# Patient Record
Sex: Female | Born: 1985 | Race: White | Hispanic: No | Marital: Married | Smoking: Never smoker
Health system: Southern US, Community
[De-identification: ages and names within clinical notes are randomized; demographics above are authoritative.]

## PROBLEM LIST (undated history)

## (undated) ENCOUNTER — Inpatient Hospital Stay (HOSPITAL_COMMUNITY): Payer: Self-pay

## (undated) DIAGNOSIS — N970 Female infertility associated with anovulation: Secondary | ICD-10-CM

## (undated) DIAGNOSIS — G90A Postural orthostatic tachycardia syndrome (POTS): Secondary | ICD-10-CM

## (undated) DIAGNOSIS — Z3480 Encounter for supervision of other normal pregnancy, unspecified trimester: Secondary | ICD-10-CM

## (undated) DIAGNOSIS — I498 Other specified cardiac arrhythmias: Secondary | ICD-10-CM

## (undated) DIAGNOSIS — Z3493 Encounter for supervision of normal pregnancy, unspecified, third trimester: Secondary | ICD-10-CM

## (undated) DIAGNOSIS — A6000 Herpesviral infection of urogenital system, unspecified: Secondary | ICD-10-CM

## (undated) DIAGNOSIS — R000 Tachycardia, unspecified: Secondary | ICD-10-CM

## (undated) DIAGNOSIS — N921 Excessive and frequent menstruation with irregular cycle: Secondary | ICD-10-CM

## (undated) DIAGNOSIS — E079 Disorder of thyroid, unspecified: Secondary | ICD-10-CM

## (undated) DIAGNOSIS — R7989 Other specified abnormal findings of blood chemistry: Secondary | ICD-10-CM

## (undated) DIAGNOSIS — D72829 Elevated white blood cell count, unspecified: Secondary | ICD-10-CM

## (undated) DIAGNOSIS — I2699 Other pulmonary embolism without acute cor pulmonale: Secondary | ICD-10-CM

## (undated) DIAGNOSIS — E282 Polycystic ovarian syndrome: Secondary | ICD-10-CM

## (undated) DIAGNOSIS — O039 Complete or unspecified spontaneous abortion without complication: Secondary | ICD-10-CM

## (undated) DIAGNOSIS — O210 Mild hyperemesis gravidarum: Secondary | ICD-10-CM

## (undated) DIAGNOSIS — I951 Orthostatic hypotension: Secondary | ICD-10-CM

## (undated) DIAGNOSIS — O0901 Supervision of pregnancy with history of infertility, first trimester: Secondary | ICD-10-CM

## (undated) HISTORY — DX: Disorder of thyroid, unspecified: E07.9

## (undated) HISTORY — DX: Excessive and frequent menstruation with irregular cycle: N92.1

## (undated) HISTORY — DX: Female infertility associated with anovulation: N97.0

## (undated) HISTORY — DX: Mild hyperemesis gravidarum: O21.0

## (undated) HISTORY — DX: Encounter for supervision of other normal pregnancy, unspecified trimester: Z34.80

## (undated) HISTORY — DX: Other specified abnormal findings of blood chemistry: R79.89

## (undated) HISTORY — DX: Elevated white blood cell count, unspecified: D72.829

## (undated) HISTORY — DX: Polycystic ovarian syndrome: E28.2

## (undated) HISTORY — DX: Herpesviral infection of urogenital system, unspecified: A60.00

## (undated) HISTORY — DX: Complete or unspecified spontaneous abortion without complication: O03.9

## (undated) HISTORY — DX: Supervision of pregnancy with history of infertility, first trimester: O09.01

---
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
## 2012-07-19 DIAGNOSIS — N921 Excessive and frequent menstruation with irregular cycle: Secondary | ICD-10-CM

## 2012-07-19 DIAGNOSIS — N970 Female infertility associated with anovulation: Secondary | ICD-10-CM

## 2012-07-19 HISTORY — DX: Excessive and frequent menstruation with irregular cycle: N92.1

## 2012-07-19 HISTORY — DX: Female infertility associated with anovulation: N97.0

## 2013-01-20 HISTORY — PX: OTHER SURGICAL HISTORY: SHX169

## 2013-02-17 DIAGNOSIS — O0901 Supervision of pregnancy with history of infertility, first trimester: Secondary | ICD-10-CM

## 2013-02-17 HISTORY — DX: Supervision of pregnancy with history of infertility, first trimester: O09.01

## 2013-04-26 ENCOUNTER — Encounter (HOSPITAL_COMMUNITY): Payer: Self-pay | Admitting: Pharmacy Technician

## 2013-04-26 ENCOUNTER — Encounter (HOSPITAL_COMMUNITY): Payer: Self-pay | Admitting: Obstetrics and Gynecology

## 2013-04-26 DIAGNOSIS — IMO0002 Reserved for concepts with insufficient information to code with codable children: Secondary | ICD-10-CM

## 2013-04-26 HISTORY — DX: Reserved for concepts with insufficient information to code with codable children: IMO0002

## 2013-04-26 NOTE — H&P (Signed)
Vicki Ortega is an 27 y.o. female. With RPOC after IVF pregnancy as missed AB.  Has had several rounds of cytotec, with small portion of likely RPOC on [place].  Pt desires definitive management, have d/w her R/B/A of D&C.   Pertinent Gynecological History: Menses: PCOS Contraception: none  DES exposure: denies Blood transfusions: none Sexually transmitted diseases: no past history Previous GYN Procedures: none  Last pap: normal  OB History: G2 P0020   Menstrual History:   No LMP recorded.    Past Medical History  Diagnosis Date  . Retained products of conception without hemorrhage 04/26/2013  Cardiac Ablation  No past surgical history on file.  FH: DM and MI Social History:  has no tobacco, alcohol, and drug history on file.married, RN  Allergies: No Known Allergies  Meds PNV    Review of Systems  Constitutional: Negative.   HENT: Negative.   Eyes: Negative.   Respiratory: Negative.   Cardiovascular: Negative.   Gastrointestinal: Negative.   Genitourinary: Negative.   Musculoskeletal: Negative.   Skin: Negative.   Neurological: Negative.   Psychiatric/Behavioral: Negative.     There were no vitals taken for this visit. Physical Exam  Constitutional: She is oriented to person, place, and time. She appears well-developed and well-nourished.  HENT:  Head: Normocephalic and atraumatic.  Cardiovascular: Normal rate and regular rhythm.   Respiratory: Effort normal and breath sounds normal. No respiratory distress.  GI: Soft. Bowel sounds are normal. There is no tenderness.  Musculoskeletal: Normal range of motion.  Neurological: She is alert and oriented to person, place, and time.  Skin: Skin is warm and dry.  Psychiatric: She has a normal mood and affect. Her behavior is normal.    No results found for this or any previous visit (from the past 24 hour(s)).  US shows small RPOC  Assessment/Plan: 27yo G2P0020 s/p missed AB treated with cytotec - several rounds  with small ammount RPOC.  Pt desires D&E for definitive management.  D/w pt r/b/a - voices understandi   BOVARD,JODYng 04/26/2013, 9:02 PM

## 2013-04-27 ENCOUNTER — Encounter (HOSPITAL_COMMUNITY): Payer: Self-pay | Admitting: Anesthesiology

## 2013-04-27 ENCOUNTER — Ambulatory Visit (HOSPITAL_COMMUNITY)
Admission: RE | Admit: 2013-04-27 | Discharge: 2013-04-27 | Disposition: A | Discharge status: Home / Self Care | Payer: BC Managed Care – PPO | Source: Ambulatory Visit | Attending: Obstetrics and Gynecology | Admitting: Obstetrics and Gynecology

## 2013-04-27 ENCOUNTER — Ambulatory Visit (HOSPITAL_COMMUNITY): Payer: BC Managed Care – PPO | Admitting: Anesthesiology

## 2013-04-27 ENCOUNTER — Encounter (HOSPITAL_COMMUNITY): Payer: Self-pay | Admitting: *Deleted

## 2013-04-27 ENCOUNTER — Encounter (HOSPITAL_COMMUNITY)
Admission: RE | Disposition: A | Discharge status: Home / Self Care | Payer: Self-pay | Source: Ambulatory Visit | Attending: Obstetrics and Gynecology

## 2013-04-27 DIAGNOSIS — IMO0002 Reserved for concepts with insufficient information to code with codable children: Secondary | ICD-10-CM

## 2013-04-27 DIAGNOSIS — O021 Missed abortion: Secondary | ICD-10-CM | POA: Insufficient documentation

## 2013-04-27 HISTORY — DX: Tachycardia, unspecified: R00.0

## 2013-04-27 HISTORY — PX: DILATION AND EVACUATION: SHX1459

## 2013-04-27 LAB — CBC
HCT: 45.7 % (ref 36.0–46.0)
Platelets: 304 10*3/uL (ref 150–400)
RDW: 11.9 % (ref 11.5–15.5)
WBC: 6.4 10*3/uL (ref 4.0–10.5)

## 2013-04-27 SURGERY — DILATION AND EVACUATION, UTERUS
Anesthesia: Monitor Anesthesia Care | Site: Uterus | Wound class: Clean Contaminated

## 2013-04-27 MED ORDER — MIDAZOLAM HCL 2 MG/2ML IJ SOLN
INTRAMUSCULAR | Status: AC
  Filled 2013-04-27: qty 2

## 2013-04-27 MED ORDER — IBUPROFEN 800 MG PO TABS: 800.0000 mg | ORAL_TABLET | Freq: Three times a day (TID) | ORAL | Status: DC | PRN

## 2013-04-27 MED ORDER — PROPOFOL 10 MG/ML IV EMUL
INTRAVENOUS | Status: DC | PRN
  Administered 2013-04-27: 50 mg via INTRAVENOUS
  Administered 2013-04-27: 20 mg via INTRAVENOUS
  Administered 2013-04-27: 50 mg via INTRAVENOUS

## 2013-04-27 MED ORDER — FENTANYL CITRATE 0.05 MG/ML IJ SOLN: 25.0000 ug | INTRAMUSCULAR | Status: DC | PRN

## 2013-04-27 MED ORDER — MEPERIDINE HCL 25 MG/ML IJ SOLN: 6.2500 mg | INTRAMUSCULAR | Status: DC | PRN

## 2013-04-27 MED ORDER — DOXYCYCLINE HYCLATE 100 MG PO TABS
ORAL_TABLET | ORAL | Status: AC
  Administered 2013-04-27: 100 mg via ORAL
  Filled 2013-04-27: qty 1

## 2013-04-27 MED ORDER — GLYCOPYRROLATE 0.2 MG/ML IJ SOLN
INTRAMUSCULAR | Status: DC | PRN
  Administered 2013-04-27: 0.2 mg via INTRAVENOUS

## 2013-04-27 MED ORDER — DOXYCYCLINE HYCLATE 100 MG PO TABS
ORAL_TABLET | ORAL | Status: AC
  Filled 2013-04-27: qty 1

## 2013-04-27 MED ORDER — LIDOCAINE HCL 2 % IJ SOLN
INTRAMUSCULAR | Status: AC
  Filled 2013-04-27: qty 20

## 2013-04-27 MED ORDER — FENTANYL CITRATE 0.05 MG/ML IJ SOLN
INTRAMUSCULAR | Status: DC | PRN
  Administered 2013-04-27 (×2): 50 ug via INTRAVENOUS

## 2013-04-27 MED ORDER — LACTATED RINGERS IV SOLN
INTRAVENOUS | Status: DC
  Administered 2013-04-27: 08:00:00 via INTRAVENOUS

## 2013-04-27 MED ORDER — HYDROCODONE-ACETAMINOPHEN 5-325 MG PO TABS: 1.0000 | ORAL_TABLET | Freq: Four times a day (QID) | ORAL | Status: DC | PRN

## 2013-04-27 MED ORDER — METOCLOPRAMIDE HCL 5 MG/ML IJ SOLN: 10.0000 mg | Freq: Once | INTRAMUSCULAR | Status: DC | PRN

## 2013-04-27 MED ORDER — DEXAMETHASONE SODIUM PHOSPHATE 10 MG/ML IJ SOLN
INTRAMUSCULAR | Status: DC | PRN
  Administered 2013-04-27: 5 mg via INTRAVENOUS

## 2013-04-27 MED ORDER — MIDAZOLAM HCL 5 MG/5ML IJ SOLN
INTRAMUSCULAR | Status: DC | PRN
  Administered 2013-04-27: 2 mg via INTRAVENOUS

## 2013-04-27 MED ORDER — LIDOCAINE HCL 2 % IJ SOLN
INTRAMUSCULAR | Status: DC | PRN
  Administered 2013-04-27: 10 mL

## 2013-04-27 MED ORDER — LIDOCAINE HCL (CARDIAC) 20 MG/ML IV SOLN
INTRAVENOUS | Status: DC | PRN
  Administered 2013-04-27: 50 mg via INTRAVENOUS

## 2013-04-27 MED ORDER — KETOROLAC TROMETHAMINE 30 MG/ML IJ SOLN
INTRAMUSCULAR | Status: DC | PRN
  Administered 2013-04-27: 30 mg via INTRAVENOUS

## 2013-04-27 MED ORDER — FENTANYL CITRATE 0.05 MG/ML IJ SOLN
INTRAMUSCULAR | Status: AC
  Filled 2013-04-27: qty 2

## 2013-04-27 MED ORDER — LACTATED RINGERS IV SOLN: INTRAVENOUS | Status: DC

## 2013-04-27 MED ORDER — ONDANSETRON HCL 4 MG/2ML IJ SOLN
INTRAMUSCULAR | Status: DC | PRN
  Administered 2013-04-27: 4 mg via INTRAVENOUS

## 2013-04-27 MED ORDER — DOXYCYCLINE HYCLATE 100 MG PO TABS
100.0000 mg | ORAL_TABLET | Freq: Two times a day (BID) | ORAL | Status: AC
  Administered 2013-04-27 (×2): 100 mg via ORAL

## 2013-04-27 SURGICAL SUPPLY — 19 items
CATH ROBINSON RED A/P 16FR (CATHETERS) ×2 IMPLANT
CLOTH BEACON ORANGE TIMEOUT ST (SAFETY) ×2 IMPLANT
DECANTER SPIKE VIAL GLASS SM (MISCELLANEOUS) ×2 IMPLANT
GLOVE BIO SURGEON STRL SZ 6.5 (GLOVE) ×2 IMPLANT
GLOVE BIO SURGEON STRL SZ7 (GLOVE) ×2 IMPLANT
GOWN STRL REIN XL XLG (GOWN DISPOSABLE) ×4 IMPLANT
KIT BERKELEY 1ST TRIMESTER 3/8 (MISCELLANEOUS) ×2 IMPLANT
NEEDLE SPNL 22GX3.5 QUINCKE BK (NEEDLE) ×2 IMPLANT
NS IRRIG 1000ML POUR BTL (IV SOLUTION) ×2 IMPLANT
PACK VAGINAL MINOR WOMEN LF (CUSTOM PROCEDURE TRAY) ×2 IMPLANT
PAD OB MATERNITY 4.3X12.25 (PERSONAL CARE ITEMS) ×2 IMPLANT
PAD PREP 24X48 CUFFED NSTRL (MISCELLANEOUS) ×2 IMPLANT
SET BERKELEY SUCTION TUBING (SUCTIONS) ×2 IMPLANT
SYR CONTROL 10ML LL (SYRINGE) ×2 IMPLANT
TOWEL OR 17X24 6PK STRL BLUE (TOWEL DISPOSABLE) ×4 IMPLANT
VACURETTE 10 RIGID CVD (CANNULA) IMPLANT
VACURETTE 7MM CVD STRL WRAP (CANNULA) ×2 IMPLANT
VACURETTE 8 RIGID CVD (CANNULA) IMPLANT
VACURETTE 9 RIGID CVD (CANNULA) IMPLANT

## 2013-04-27 NOTE — Transfer of Care (Signed)
Immediate Anesthesia Transfer of Care Note  Patient: Vicki Ortega  Procedure(s) Performed: Procedure(s): DILATATION AND EVACUATION (N/A)  Patient Location: PACU  Anesthesia Type:MAC  Level of Consciousness: awake, alert  and oriented  Airway & Oxygen Therapy: Patient Spontanous Breathing  Post-op Assessment: Report given to PACU RN and Post -op Vital signs reviewed and stable  Post vital signs: Reviewed and stable  Complications: No apparent anesthesia complications

## 2013-04-27 NOTE — Interval H&P Note (Signed)
History and Physical Interval Note:  04/27/2013 8:20 AM  Vicki Ortega  has presented today for surgery, with the diagnosis of missed ab  The various methods of treatment have been discussed with the patient and family. After consideration of risks, benefits and other options for treatment, the patient has consented to  Procedure(s): DILATATION AND EVACUATION (N/A) as a surgical intervention .  The patient's history has been reviewed, patient examined, no change in status, stable for surgery.  I have reviewed the patient's chart and labs.  Questions were answered to the patient's satisfaction.     BOVARD,Bryley Chrisman

## 2013-04-27 NOTE — Anesthesia Postprocedure Evaluation (Signed)
  Anesthesia Post-op Note  Patient: Vicki Ortega  Procedure(s) Performed: Procedure(s): DILATATION AND EVACUATION (N/A)  Patient Location: PACU  Anesthesia Type:MAC  Level of Consciousness: awake, alert  and oriented  Airway and Oxygen Therapy: Patient Spontanous Breathing  Post-op Pain: none  Post-op Assessment: Post-op Vital signs reviewed, Patient's Cardiovascular Status Stable, Respiratory Function Stable, Patent Airway, No signs of Nausea or vomiting and Pain level controlled  Post-op Vital Signs: Reviewed and stable  Complications: No apparent anesthesia complications

## 2013-04-27 NOTE — Anesthesia Preprocedure Evaluation (Addendum)
Anesthesia Evaluation  Patient identified by MRN, date of birth, ID band Patient awake    Reviewed: Allergy & Precautions, H&P , Patient's Chart, lab work & pertinent test results  Airway Mallampati: III TM Distance: >3 FB Neck ROM: Full    Dental no notable dental hx. (+) Teeth Intact   Pulmonary neg pulmonary ROS,  breath sounds clear to auscultation  Pulmonary exam normal       Cardiovascular Rhythm:Regular + Systolic murmurs S/P EPS with ablation for POTS   Neuro/Psych negative neurological ROS  negative psych ROS   GI/Hepatic negative GI ROS, Neg liver ROS,   Endo/Other  negative endocrine ROS  Renal/GU negative Renal ROS  negative genitourinary   Musculoskeletal negative musculoskeletal ROS (+)   Abdominal Normal abdominal exam  (+)   Peds  Hematology negative hematology ROS (+)   Anesthesia Other Findings   Reproductive/Obstetrics Incomplete/Missed Ab Infertility                          Anesthesia Physical Anesthesia Plan  ASA: II  Anesthesia Plan: MAC   Post-op Pain Management:    Induction: Intravenous  Airway Management Planned: Natural Airway  Additional Equipment:   Intra-op Plan:   Post-operative Plan:   Informed Consent: I have reviewed the patients History and Physical, chart, labs and discussed the procedure including the risks, benefits and alternatives for the proposed anesthesia with the patient or authorized representative who has indicated his/her understanding and acceptance.   Dental advisory given  Plan Discussed with: CRNA, Anesthesiologist and Surgeon  Anesthesia Plan Comments:         Anesthesia Quick Evaluation

## 2013-04-27 NOTE — Brief Op Note (Signed)
04/27/2013  9:11 AM  PATIENT:  Vicki Ortega  27 y.o. female  PRE-OPERATIVE DIAGNOSIS:  missed ab, RPOC  POST-OPERATIVE DIAGNOSIS:  missed ab, RPOC  PROCEDURE:  Procedure(s): DILATATION AND EVACUATION (N/A)  SURGEON:  Surgeon(s) and Role:    * Sherron Monday, MD - Primary  ANESTHESIA:   IV sedation/ MAC  EBL:  Total I/O In: 500 [I.V.:500] Out: 30 [Urine:30]  BLOOD ADMINISTERED:none  DRAINS: none   LOCAL MEDICATIONS USED:  LIDOCAINE   SPECIMEN:  Source of Specimen:  POC  DISPOSITION OF SPECIMEN:  PATHOLOGY  COUNTS:  YES  TOURNIQUET:  * No tourniquets in log *  DICTATION: .Other Dictation: Dictation Number [id number]  PLAN OF CARE: Discharge to home after PACU  PATIENT DISPOSITION:  PACU - hemodynamically stable.   Delay start of Pharmacological VTE agent (>24hrs) due to surgical blood loss or risk of bleeding: not applicable

## 2013-04-28 DIAGNOSIS — O021 Missed abortion: Secondary | ICD-10-CM

## 2013-04-28 NOTE — Op Note (Signed)
NAME:  Vicki Ortega, ESTEVE NO.:  [id number]  MEDICAL RECORD NO.:  [id number]  LOCATION:  [facility]                          FACILITY:  [facility]  PHYSICIAN:  Sherron Monday, MD        DATE OF BIRTH:  [birth year]  DATE OF PROCEDURE:  04/27/2013 DATE OF DISCHARGE:  04/27/2013                              OPERATIVE REPORT   PREOPERATIVE DIAGNOSIS:  Missed abortion, status post Cytotec with retained products of conception.  POSTOPERATIVE DIAGNOSIS:  Missed abortion, status post Cytotec with retained products of conception.  PROCEDURE:  D and E.  SURGEON:  Alazar Cherian Bovard, MD  ANESTHESIA:  MAC.  ESTIMATED BLOOD LOSS:  Minimal.  IV FLUIDS:  500 mL.  URINE OUTPUT:  30 mL by I and O cath prior to procedure.  SPECIMEN:  Products of conception to Pathology.  COMPLICATIONS:  None.  DESCRIPTION OF PROCEDURE:  After informed consent was reviewed with the patient and her husband, she was transported to the operating room, where MAC anesthesia was placed and found to be adequate.  She was then placed in the [place] stirrups, prepped and draped in the normal sterile fashion.  An open-sided speculum was used to easily visualize her cervix.  A 10 mL of 2% lidocaine were used for paracervical block. A single-tooth tenaculum was applied to the anterior lip.  The cervix was dilated to accommodate a 7-French curette using Pratt dilators to 21.  Prior to the procedure, bimanual exam was performed, finding the uterus to be in midline position.  Uterus was measured and found to be approximately 7 cm.  With 2 to 3 passes with the curette, the products were evacuated from the uterus, this was sent to Pathology.  The tenaculum was removed. The cervix was noted to be hemostatic.  Speculum was removed.  The patient was turned in supine position.  Sponge, lap, and needle counts were correct x2.  The patient tolerated the procedure well.  She was transferred back in stable condition.     Sherron Monday, MD     JB/MEDQ  D:  04/27/2013  T:  04/28/2013  Job:  [id number]

## 2013-04-30 ENCOUNTER — Encounter (HOSPITAL_COMMUNITY): Payer: Self-pay | Admitting: Obstetrics and Gynecology

## 2014-03-05 DIAGNOSIS — R7989 Other specified abnormal findings of blood chemistry: Secondary | ICD-10-CM | POA: Insufficient documentation

## 2014-03-05 HISTORY — DX: Other specified abnormal findings of blood chemistry: R79.89

## 2014-07-08 LAB — OB RESULTS CONSOLE ABO/RH: RH Type: POSITIVE

## 2014-07-08 LAB — OB RESULTS CONSOLE RPR: RPR: NONREACTIVE

## 2014-07-08 LAB — OB RESULTS CONSOLE HIV ANTIBODY (ROUTINE TESTING): HIV: NONREACTIVE

## 2014-08-30 NOTE — L&D Delivery Note (Signed)
Delivery Note At 8:31 PM a viable and healthy female was delivered via Vaginal, Spontaneous Delivery (Presentation: OA; ROT  ).  APGAR: 8,9 ; weight P .   Placenta status: delivered, intact.  Cord: 3VC  with the following complications: nuchal.    Anesthesia: Epidural  Episiotomy:  None Lacerations:  R labial Suture Repair: 3.0 vicryl rapide Est. Blood Loss (mL):  400cc  Mom to postpartum.  Baby to Couplet care / Skin to Skin.  Bovard-Stuckert, Cherylin Waguespack 10/01/2014, 8:58 PM  D/W pt and FOB circumcision for female infant including r/b/a  Br/O+/Contra ?/Tdap in PNC/R?

## 2014-09-05 LAB — OB RESULTS CONSOLE GBS: GBS: NEGATIVE

## 2014-09-30 ENCOUNTER — Encounter (HOSPITAL_COMMUNITY): Payer: Self-pay | Admitting: *Deleted

## 2014-09-30 ENCOUNTER — Encounter (HOSPITAL_COMMUNITY): Payer: Self-pay

## 2014-09-30 ENCOUNTER — Telehealth (HOSPITAL_COMMUNITY): Payer: Self-pay | Admitting: *Deleted

## 2014-09-30 DIAGNOSIS — Z3493 Encounter for supervision of normal pregnancy, unspecified, third trimester: Secondary | ICD-10-CM

## 2014-09-30 HISTORY — DX: Encounter for supervision of normal pregnancy, unspecified, third trimester: Z34.93

## 2014-09-30 NOTE — H&P (Signed)
Vicki Ortega is a 29 y.o. female G3P0020 at 39+ with IUP from FET for IOL given term status and favorable cervix.  Relatively uncomplicated PNC, except h/o HSV - Valtrex for suppression.  H/o previa resolved.  ? Thyroid dz, nl when evaluated. D/W pt r/b/a of IOL and POC, voices understanding.    Maternal Medical History:  Contractions: Frequency: irregular.   Perceived severity is moderate.    Fetal activity: Perceived fetal activity is normal.    Prenatal complications: no prenatal complications Prenatal Complications - Diabetes: none.    OB History    Gravida Para Term Preterm AB TAB SAB Ectopic Multiple Living   3 0            G1 and G2 SAB, G3 present.  No abn pap, h/o HSV 2, rare outbreaks.    Past Medical History  Diagnosis Date  . Retained products of conception without hemorrhage 04/26/2013  . 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 PCOS (polycystic ovarian syndrome)   . Thyroid disease   . Normal intrauterine pregnancy in third trimester 09/30/2014   Past Surgical History  Procedure Laterality Date  . Cardiac electrophysiology mapping and ablation  2004  . Egg retrieval  01/20/13  . Dilation and evacuation N/A 04/27/2013    Procedure: DILATATION AND EVACUATION;  Surgeon: Sherron MondayJody Bovard, MD;  Location: [facility];  Service: Gynecology;  Laterality: N/A;   Family History: family history includes Diabetes in her mother; Heart attack in her paternal grandfather. Social History:  reports that she has never smoked. She has never used smokeless tobacco. She reports that she does not drink alcohol or use illicit drugs. married, RN psychiatry Meds PNV, Valrex All NKDA   Prenatal Transfer Tool  Maternal Diabetes: No Genetic Screening: Normal Maternal Ultrasounds/Referrals: Normal Fetal Ultrasounds or other Referrals:  None Maternal Substance Abuse:  No Significant Maternal Medications:  None Significant Maternal Lab Results:  Lab values include:  Group B Strep negative Other Comments:  nl thyroid screening, previa resolved,   Review of Systems  Constitutional: Negative.   HENT: Negative.   Eyes: Negative.   Respiratory: Negative.   Cardiovascular: Negative.   Gastrointestinal: Negative.   Genitourinary: Negative.   Musculoskeletal: Negative.   Skin: Negative.   Neurological: Negative.   Psychiatric/Behavioral: Negative.       Last menstrual period 12/28/2013. Maternal Exam:  Abdomen: Patient reports no abdominal tenderness. Fundal height is appropriate for gestation.   Estimated fetal weight is 7-8#.   Fetal presentation: vertex  Introitus: Normal vulva. Normal vagina.  Pelvis: adequate for delivery.   Cervix: Cervix evaluated by digital exam.     Physical Exam  Constitutional: She is oriented to person, place, and time. She appears well-developed and well-nourished.  HENT:  Head: Normocephalic and atraumatic.  Cardiovascular: Normal rate and regular rhythm.   Respiratory: Effort normal and breath sounds normal. No respiratory distress. She has no wheezes.  GI: Soft. Bowel sounds are normal. She exhibits no distension. There is no tenderness.  Musculoskeletal: Normal range of motion.  Neurological: She is alert and oriented to person, place, and time.  Skin: Skin is warm and dry.  Psychiatric: She has a normal mood and affect. Her behavior is normal.    Prenatal labs: ABO, Rh: O/Positive/-- (11/09 0000) Antibody:   Rubella:   RPR: Nonreactive (11/09 0000)  HBsAg:    HIV: Non-reactive (11/09 0000)  GBS: Negative (01/07 0000)   First Tri Screen WNL, nl  [place] - w nl NT, glucola 119, TSH WNL  Nl anal post plac LLP,  Previa resolved Tdao 11/15 CF neg "Vicki Ortega"  Assessment/Plan: [id number] at 39+ for IOL gbbs neg IOL w/ ptiocin and AROM expect SVDepidural prn    Bovard-Stuckert, Kaidin Boehle 09/30/2014, 10:10 PM

## 2014-09-30 NOTE — Telephone Encounter (Signed)
Preadmission screen  

## 2014-10-01 ENCOUNTER — Inpatient Hospital Stay (HOSPITAL_COMMUNITY)
Admission: RE | Admit: 2014-10-01 | Discharge: 2014-10-03 | DRG: 775 | Disposition: A | Discharge status: Home / Self Care | Payer: PRIVATE HEALTH INSURANCE | Source: Ambulatory Visit | Attending: Obstetrics and Gynecology | Admitting: Obstetrics and Gynecology

## 2014-10-01 ENCOUNTER — Encounter (HOSPITAL_COMMUNITY): Payer: Self-pay

## 2014-10-01 ENCOUNTER — Inpatient Hospital Stay (HOSPITAL_COMMUNITY): Payer: PRIVATE HEALTH INSURANCE | Admitting: Anesthesiology

## 2014-10-01 VITALS — BP 97/59 | HR 61 | Temp 97.2°F | Resp 18 | Ht 64.0 in | Wt 149.0 lb

## 2014-10-01 DIAGNOSIS — Z3A39 39 weeks gestation of pregnancy: Secondary | ICD-10-CM | POA: Diagnosis present

## 2014-10-01 DIAGNOSIS — Z3483 Encounter for supervision of other normal pregnancy, third trimester: Secondary | ICD-10-CM | POA: Diagnosis present

## 2014-10-01 DIAGNOSIS — Z3480 Encounter for supervision of other normal pregnancy, unspecified trimester: Secondary | ICD-10-CM

## 2014-10-01 DIAGNOSIS — Z3493 Encounter for supervision of normal pregnancy, unspecified, third trimester: Secondary | ICD-10-CM

## 2014-10-01 HISTORY — DX: Encounter for supervision of other normal pregnancy, unspecified trimester: Z34.80

## 2014-10-01 HISTORY — DX: Encounter for supervision of normal pregnancy, unspecified, third trimester: Z34.93

## 2014-10-01 LAB — CBC
HEMATOCRIT: 39.1 % (ref 36.0–46.0)
Hemoglobin: 12.7 g/dL (ref 12.0–15.0)
MCH: 28.9 pg (ref 26.0–34.0)
MCHC: 32.5 g/dL (ref 30.0–36.0)
MCV: 88.9 fL (ref 78.0–100.0)
Platelets: 325 10*3/uL (ref 150–400)
RBC: 4.4 MIL/uL (ref 3.87–5.11)
RDW: 14.5 % (ref 11.5–15.5)
WBC: 13.8 10*3/uL — ABNORMAL HIGH (ref 4.0–10.5)

## 2014-10-01 LAB — TYPE AND SCREEN
ABO/RH(D): O POS
Antibody Screen: NEGATIVE

## 2014-10-01 LAB — ABO/RH: ABO/RH(D): O POS

## 2014-10-01 MED ORDER — OXYTOCIN 40 UNITS IN LACTATED RINGERS INFUSION - SIMPLE MED: 62.5000 mL/h | INTRAVENOUS | Status: DC

## 2014-10-01 MED ORDER — BUTORPHANOL TARTRATE 1 MG/ML IJ SOLN
1.0000 mg | INTRAMUSCULAR | Status: DC | PRN
  Administered 2014-10-01: 1 mg via INTRAVENOUS
  Filled 2014-10-01: qty 1

## 2014-10-01 MED ORDER — EPHEDRINE 5 MG/ML INJ
10.0000 mg | INTRAVENOUS | Status: DC | PRN
  Filled 2014-10-01: qty 2

## 2014-10-01 MED ORDER — ONDANSETRON HCL 4 MG/2ML IJ SOLN: 4.0000 mg | Freq: Four times a day (QID) | INTRAMUSCULAR | Status: DC | PRN

## 2014-10-01 MED ORDER — OXYTOCIN 40 UNITS IN LACTATED RINGERS INFUSION - SIMPLE MED
1.0000 m[IU]/min | INTRAVENOUS | Status: DC
Start: 2014-10-01 — End: 2014-10-01
  Administered 2014-10-01: 2 m[IU]/min via INTRAVENOUS
  Filled 2014-10-01: qty 1000

## 2014-10-01 MED ORDER — DIPHENHYDRAMINE HCL 50 MG/ML IJ SOLN: 12.5000 mg | INTRAMUSCULAR | Status: DC | PRN

## 2014-10-01 MED ORDER — OXYCODONE-ACETAMINOPHEN 5-325 MG PO TABS: 2.0000 | ORAL_TABLET | ORAL | Status: DC | PRN

## 2014-10-01 MED ORDER — FENTANYL 2.5 MCG/ML BUPIVACAINE 1/10 % EPIDURAL INFUSION (WH - ANES)
INTRAMUSCULAR | Status: DC | PRN
  Administered 2014-10-01: 14 mL/h via EPIDURAL

## 2014-10-01 MED ORDER — PHENYLEPHRINE 40 MCG/ML (10ML) SYRINGE FOR IV PUSH (FOR BLOOD PRESSURE SUPPORT)
80.0000 ug | PREFILLED_SYRINGE | INTRAVENOUS | Status: DC | PRN
  Filled 2014-10-01: qty 2

## 2014-10-01 MED ORDER — LACTATED RINGERS IV SOLN
INTRAVENOUS | Status: DC
  Administered 2014-10-01: 125 mL/h via INTRAVENOUS

## 2014-10-01 MED ORDER — LIDOCAINE HCL (PF) 1 % IJ SOLN
30.0000 mL | INTRAMUSCULAR | Status: DC | PRN
  Filled 2014-10-01: qty 30

## 2014-10-01 MED ORDER — TERBUTALINE SULFATE 1 MG/ML IJ SOLN
0.2500 mg | Freq: Once | INTRAMUSCULAR | Status: DC | PRN
  Filled 2014-10-01: qty 1

## 2014-10-01 MED ORDER — PHENYLEPHRINE 40 MCG/ML (10ML) SYRINGE FOR IV PUSH (FOR BLOOD PRESSURE SUPPORT)
80.0000 ug | PREFILLED_SYRINGE | INTRAVENOUS | Status: DC | PRN
  Filled 2014-10-01: qty 2
  Filled 2014-10-01: qty 20

## 2014-10-01 MED ORDER — OXYTOCIN BOLUS FROM INFUSION
500.0000 mL | INTRAVENOUS | Status: DC
  Administered 2014-10-01: 500 mL via INTRAVENOUS

## 2014-10-01 MED ORDER — LACTATED RINGERS IV SOLN: 500.0000 mL | INTRAVENOUS | Status: DC | PRN

## 2014-10-01 MED ORDER — FENTANYL 2.5 MCG/ML BUPIVACAINE 1/10 % EPIDURAL INFUSION (WH - ANES)
14.0000 mL/h | INTRAMUSCULAR | Status: DC | PRN
  Administered 2014-10-01: 14 mL/h via EPIDURAL
  Filled 2014-10-01: qty 125

## 2014-10-01 MED ORDER — LIDOCAINE HCL (PF) 1 % IJ SOLN
INTRAMUSCULAR | Status: DC | PRN
Start: 2014-10-01 — End: 2014-10-01
  Administered 2014-10-01 (×2): 9 mL

## 2014-10-01 MED ORDER — ACETAMINOPHEN 325 MG PO TABS: 650.0000 mg | ORAL_TABLET | ORAL | Status: DC | PRN

## 2014-10-01 MED ORDER — OXYCODONE-ACETAMINOPHEN 5-325 MG PO TABS: 1.0000 | ORAL_TABLET | ORAL | Status: DC | PRN

## 2014-10-01 MED ORDER — CITRIC ACID-SODIUM CITRATE 334-500 MG/5ML PO SOLN: 30.0000 mL | ORAL | Status: DC | PRN

## 2014-10-01 MED ORDER — LACTATED RINGERS IV SOLN
500.0000 mL | Freq: Once | INTRAVENOUS | Status: AC
  Administered 2014-10-01: 500 mL via INTRAVENOUS

## 2014-10-01 NOTE — Anesthesia Procedure Notes (Signed)
Epidural Patient location during procedure: OB Start time: 10/01/2014 3:35 PM End time: 10/01/2014 3:39 PM  Staffing Anesthesiologist: Leilani AbleHATCHETT, Hussein Macdougal Performed by: anesthesiologist   Preanesthetic Checklist Completed: patient identified, surgical consent, pre-op evaluation, timeout performed, IV checked, risks and benefits discussed and monitors and equipment checked  Epidural Patient position: sitting Prep: site prepped and draped and DuraPrep Patient monitoring: continuous pulse ox and blood pressure Approach: midline Location: L3-L4 Injection technique: LOR air  Needle:  Needle type: Tuohy  Needle gauge: 17 G Needle length: 9 cm and 9 Needle insertion depth: 5 cm cm Catheter type: closed end flexible Catheter size: 19 Gauge Catheter at skin depth: 10 cm Test dose: negative and Other  Assessment Sensory level: T9 Events: blood not aspirated, injection not painful, no injection resistance, negative IV test and no paresthesia

## 2014-10-01 NOTE — Progress Notes (Signed)
Patient ID: Vicki Ortega, female   DOB: 27-Apr-1986, 29 y.o.   MRN: [id number]  Comfortable with epidural  AFVSS gen NAD FHTs 120s, great variability, some early decels toco Q 3min  SVE 8.9/90/0-+1  Continue IOL, expect SVD

## 2014-10-01 NOTE — Progress Notes (Signed)
Patient ID: Vicki Ortega, female   DOB: 01/21/1986, 29 y.o.   MRN: [id number]  No c/o's.  +FM  AFVSS  gen NAD FHTs 150's, good var, category 1 toco occ   AROM for clear fluid w/o diff/comp SVE 2.3/50/-2  Continue IOL, close monitoring

## 2014-10-01 NOTE — Progress Notes (Signed)
When pt sits straight up for uc fhr is acturally tracing maternal hr

## 2014-10-01 NOTE — Anesthesia Preprocedure Evaluation (Signed)

## 2014-10-01 NOTE — Progress Notes (Signed)
Dr Ellyn HackBovard notified that Hep B and Rubella Titer needs to be ordered

## 2014-10-02 LAB — CBC
HCT: 32.7 % — ABNORMAL LOW (ref 36.0–46.0)
Hemoglobin: 10.9 g/dL — ABNORMAL LOW (ref 12.0–15.0)
MCH: 29.2 pg (ref 26.0–34.0)
MCHC: 33.3 g/dL (ref 30.0–36.0)
MCV: 87.7 fL (ref 78.0–100.0)
Platelets: 286 10*3/uL (ref 150–400)
RBC: 3.73 MIL/uL — ABNORMAL LOW (ref 3.87–5.11)
RDW: 14.4 % (ref 11.5–15.5)
WBC: 18.8 10*3/uL — AB (ref 4.0–10.5)

## 2014-10-02 LAB — RPR: RPR: NONREACTIVE

## 2014-10-02 LAB — RUBELLA SCREEN: RUBELLA: 1.76 {index} (ref >0.99)

## 2014-10-02 LAB — HEPATITIS B SURFACE ANTIGEN: Hepatitis B Surface Ag: NEGATIVE

## 2014-10-02 MED ORDER — OXYCODONE-ACETAMINOPHEN 5-325 MG PO TABS
2.0000 | ORAL_TABLET | ORAL | Status: DC | PRN
Start: 2014-10-02 — End: 2014-10-03

## 2014-10-02 MED ORDER — ZOLPIDEM TARTRATE 5 MG PO TABS: 5.0000 mg | ORAL_TABLET | Freq: Every evening | ORAL | Status: DC | PRN

## 2014-10-02 MED ORDER — ONDANSETRON HCL 4 MG/2ML IJ SOLN: 4.0000 mg | INTRAMUSCULAR | Status: DC | PRN

## 2014-10-02 MED ORDER — LANOLIN HYDROUS EX OINT: TOPICAL_OINTMENT | CUTANEOUS | Status: DC | PRN

## 2014-10-02 MED ORDER — LACTATED RINGERS IV SOLN
INTRAVENOUS | Status: DC
Start: 2014-10-02 — End: 2014-10-03

## 2014-10-02 MED ORDER — BENZOCAINE-MENTHOL 20-0.5 % EX AERO
1.0000 "application " | INHALATION_SPRAY | CUTANEOUS | Status: DC | PRN
  Administered 2014-10-02: 1 via TOPICAL
  Filled 2014-10-02: qty 56

## 2014-10-02 MED ORDER — DIPHENHYDRAMINE HCL 25 MG PO CAPS: 25.0000 mg | ORAL_CAPSULE | Freq: Four times a day (QID) | ORAL | Status: DC | PRN

## 2014-10-02 MED ORDER — PRENATAL MULTIVITAMIN CH
1.0000 | ORAL_TABLET | Freq: Every day | ORAL | Status: DC
  Administered 2014-10-02: 1 via ORAL
  Filled 2014-10-02: qty 1

## 2014-10-02 MED ORDER — SIMETHICONE 80 MG PO CHEW: 80.0000 mg | CHEWABLE_TABLET | ORAL | Status: DC | PRN

## 2014-10-02 MED ORDER — ONDANSETRON HCL 4 MG PO TABS: 4.0000 mg | ORAL_TABLET | ORAL | Status: DC | PRN

## 2014-10-02 MED ORDER — IBUPROFEN 600 MG PO TABS
600.0000 mg | ORAL_TABLET | Freq: Four times a day (QID) | ORAL | Status: DC
  Administered 2014-10-02 – 2014-10-03 (×6): 600 mg via ORAL
  Filled 2014-10-02 (×6): qty 1

## 2014-10-02 MED ORDER — DIBUCAINE 1 % RE OINT: 1.0000 "application " | TOPICAL_OINTMENT | RECTAL | Status: DC | PRN

## 2014-10-02 MED ORDER — WITCH HAZEL-GLYCERIN EX PADS: 1.0000 "application " | MEDICATED_PAD | CUTANEOUS | Status: DC | PRN

## 2014-10-02 MED ORDER — SENNOSIDES-DOCUSATE SODIUM 8.6-50 MG PO TABS
2.0000 | ORAL_TABLET | ORAL | Status: DC
  Administered 2014-10-02 (×2): 2 via ORAL
  Filled 2014-10-02 (×2): qty 2

## 2014-10-02 MED ORDER — OXYCODONE-ACETAMINOPHEN 5-325 MG PO TABS: 1.0000 | ORAL_TABLET | ORAL | Status: DC | PRN

## 2014-10-02 NOTE — Anesthesia Postprocedure Evaluation (Signed)
  Anesthesia Post-op Note  Patient: Vicki Ortega  Procedure(s) Performed: * No procedures listed *  Patient Location: Mother/Baby  Anesthesia Type:Epidural  Level of Consciousness: awake, alert , oriented and patient cooperative  Airway and Oxygen Therapy: Patient Spontanous Breathing  Post-op Pain: mild  Post-op Assessment: Post-op Vital signs reviewed, Patient's Cardiovascular Status Stable, Respiratory Function Stable, Patent Airway, No headache, No backache, No residual numbness and No residual motor weakness  Post-op Vital Signs: Reviewed and stable  Last Vitals:  Filed Vitals:   10/02/14 0539  BP: 107/57  Pulse: 69  Temp: 36.9 C  Resp: 18    Complications: No apparent anesthesia complications

## 2014-10-02 NOTE — Lactation Note (Addendum)
This note was copied from the chart of Boy Delice Bisonara Andujo. Lactation Consultation Note Initial visit at 26 hours of age.  Mom has a history of questionable PCOS, she said her fertility specialist said she didn't have it.  Mom used IVF and frozen embryos unsuccessfully, and then conceived naturally.   Mom reports having nipple pain and [facility] already in use at bedside.  Mom has colostrum in flanges with reddened nipple and slight bruising noted.  Baby is showing feeding cues in FOB's arms.  Brief oral assessment reveals short tight frenulum and poor tongue coordination with tongue humping."  Assisted with latch in football hold on right breast.  Mom first allows baby to latch to tip of nipple instructed on how to unlatch baby.   Assisted with wide deeper latch and mom reports less pain.  Previous latch scores of 8-9, and mom reports they have been deeper latches unlike first attempt.  Baby maintains good latch for 20 minutes observed with vigorous sucking noted.   Nipple is slightly compressed with brief interruption of feeding.  Discussed exclusive pumping and nipple shield use at length.  Mom wants to continue with working on deeper latch.  Discussed how tongue mobility affects latching and transferring milk and how this may be a challenge for baby to discuss with Dr. Laverle PatterMom plans to use DEBP to support induction of lactation.  Instructions on use and cleaning given.  [facility] given and discussed.  Encouraged to feed with early cues on demand.  Early newborn behavior discussed.  Hand expression demonstrated with colostrum visible. Comfort gels given.  Mom to call for assist as needed.      Patient Name: Boy Eduardo Osierara Lilja ZOXWR'UToday's Date: 10/02/2014 Reason for consult: Initial assessment;Breast/nipple pain;Difficult latch   Maternal Data Has patient been taught Hand Expression?: Yes Does the patient have breastfeeding experience prior to this delivery?: No  Feeding Feeding Type: Breast  Fed Length of feed:  (observed 20 minutes)  LATCH Score/Interventions Latch: Grasps breast easily, tongue down, lips flanged, rhythmical sucking. Intervention(s): Adjust position;Assist with latch;Breast massage;Breast compression  Audible Swallowing: Spontaneous and intermittent Intervention(s): Skin to skin;Hand expression  Type of Nipple: Everted at rest and after stimulation  Comfort (Breast/Nipple): Filling, red/small blisters or bruises, mild/mod discomfort  Problem noted: Mild/Moderate discomfort Interventions (Mild/moderate discomfort): Hand expression;Comfort gels;Post-pump  Hold (Positioning): Assistance needed to correctly position infant at breast and maintain latch. Intervention(s): Breastfeeding basics reviewed;Support Pillows;Position options;Skin to skin  LATCH Score: 8  Lactation Tools Discussed/Used Tools: Comfort gels;Pump Breast pump type: Double-Electric Breast Pump Pump Review: Setup, frequency, and cleaning Initiated by:: JS Date initiated:: 10/02/14   Consult Status Consult Status: Follow-up Date: 10/03/14 Follow-up type: In-patient    Jannifer RodneyShoptaw, Jana Lynn 10/02/2014, 10:44 PM

## 2014-10-02 NOTE — Progress Notes (Signed)
Post Partum Day 1 Subjective: no complaints, up ad lib, voiding, tolerating PO and nl lochia, pain controlled  Objective: Blood pressure 107/57, pulse 69, temperature 98.5 F (36.9 C), temperature source Oral, resp. rate 18, height 5\' 4"  (1.626 m), weight 67.586 kg (149 lb), last menstrual period 12/28/2013, SpO2 98 %, unknown if currently breastfeeding.  Physical Exam:  General: alert and no distress Lochia: appropriate Uterine Fundus: firm   Recent Labs  10/01/14 0810 10/02/14 0610  HGB 12.7 10.9*  HCT 39.1 32.7*    Assessment/Plan: Plan for discharge tomorrow, Breastfeeding and Lactation consult   LOS: 1 day   Bovard-Stuckert, Javarie Crisp 10/02/2014, 8:11 AM

## 2014-10-03 MED ORDER — IBUPROFEN 800 MG PO TABS: 800.0000 mg | ORAL_TABLET | Freq: Three times a day (TID) | ORAL | Status: DC | PRN

## 2014-10-03 MED ORDER — OXYCODONE-ACETAMINOPHEN 5-325 MG PO TABS: 1.0000 | ORAL_TABLET | Freq: Four times a day (QID) | ORAL | Status: DC | PRN

## 2014-10-03 MED ORDER — PRENATAL MULTIVITAMIN CH: 1.0000 | ORAL_TABLET | Freq: Every day | ORAL | Status: DC

## 2014-10-03 NOTE — Progress Notes (Signed)
Post Partum Day 2 Subjective: no complaints, up ad lib, voiding, tolerating PO and nl lochia, pain controlled  Objective: Blood pressure 97/59, pulse 61, temperature 97.2 F (36.2 C), temperature source Oral, resp. rate 18, height 5\' 4"  (1.626 m), weight 67.586 kg (149 lb), last menstrual period 12/28/2013, SpO2 99 %, unknown if currently breastfeeding.  Physical Exam:  General: alert and no distress Lochia: appropriate Uterine Fundus: firm  Recent Labs  10/01/14 0810 10/02/14 0610  HGB 12.7 10.9*  HCT 39.1 32.7*    Assessment/Plan: Discharge home.  D/c with motrin, percocet and PNV.  F/u 6 wks.     LOS: 2 days   Bovard-Stuckert, Suheily Birks 10/03/2014, 8:01 AM

## 2014-10-03 NOTE — Discharge Summary (Signed)
Obstetric Discharge Summary Reason for Admission: induction of labor Prenatal Procedures: none Intrapartum Procedures: spontaneous vaginal delivery Postpartum Procedures: none Complications-Operative and Postpartum: labial laceration HEMOGLOBIN  Date Value Ref Range Status  10/02/2014 10.9* 12.0 - 15.0 g/dL Final   HCT  Date Value Ref Range Status  10/02/2014 32.7* 36.0 - 46.0 % Final    Physical Exam:  General: alert and no distress Lochia: appropriate Uterine Fundus: firm  Discharge Diagnoses: Term Pregnancy-delivered  Discharge Information: Date: 10/03/2014 Activity: pelvic rest Diet: routine Medications: PNV, Ibuprofen and Percocet Condition: stable Instructions: refer to practice specific booklet Discharge to: home Follow-up Information    Follow up with Bovard-Stuckert, Christena Sunderlin, MD. Schedule an appointment as soon as possible for a visit in 6 weeks.   Specialty:  Obstetrics and Gynecology   Why:  for postpartum exam   Contact information:   [address] [place] [id number] [phone]       Newborn Data: Live born female  Birth Weight: 6 lb 9.3 oz (2985 g) APGAR: 8, 9  Home with mother.  Bovard-Stuckert, Dalyah Pla 10/03/2014, 8:16 AM

## 2014-10-04 ENCOUNTER — Inpatient Hospital Stay (HOSPITAL_COMMUNITY)
Admission: AD | Admit: 2014-10-04 | Payer: BC Managed Care – PPO | Source: Ambulatory Visit | Admitting: Obstetrics and Gynecology

## 2017-07-15 ENCOUNTER — Emergency Department (HOSPITAL_COMMUNITY): Payer: BLUE CROSS/BLUE SHIELD

## 2017-07-15 ENCOUNTER — Other Ambulatory Visit: Payer: Self-pay

## 2017-07-15 ENCOUNTER — Emergency Department (HOSPITAL_COMMUNITY)
Admission: EM | Admit: 2017-07-15 | Discharge: 2017-07-15 | Disposition: A | Discharge status: Home / Self Care | Payer: BLUE CROSS/BLUE SHIELD | Attending: Emergency Medicine | Admitting: Emergency Medicine

## 2017-07-15 ENCOUNTER — Encounter (HOSPITAL_COMMUNITY): Payer: Self-pay

## 2017-07-15 DIAGNOSIS — R0789 Other chest pain: Secondary | ICD-10-CM | POA: Diagnosis present

## 2017-07-15 DIAGNOSIS — R000 Tachycardia, unspecified: Secondary | ICD-10-CM | POA: Diagnosis not present

## 2017-07-15 HISTORY — DX: Tachycardia, unspecified: R00.0

## 2017-07-15 HISTORY — DX: Postural orthostatic tachycardia syndrome (POTS): G90.A

## 2017-07-15 HISTORY — DX: Orthostatic hypotension: I95.1

## 2017-07-15 HISTORY — DX: Other specified cardiac arrhythmias: I49.8

## 2017-07-15 LAB — CBC WITH DIFFERENTIAL/PLATELET
BASOS ABS: 0 10*3/uL (ref 0.0–0.1)
BASOS PCT: 0 %
EOS ABS: 0.1 10*3/uL (ref 0.0–0.7)
EOS PCT: 1 %
HCT: 45.1 % (ref 36.0–46.0)
Hemoglobin: 15.2 g/dL — ABNORMAL HIGH (ref 12.0–15.0)
Lymphocytes Relative: 24 %
Lymphs Abs: 2.2 10*3/uL (ref 0.7–4.0)
MCH: 32.8 pg (ref 26.0–34.0)
MCHC: 33.7 g/dL (ref 30.0–36.0)
MCV: 97.4 fL (ref 78.0–100.0)
MONO ABS: 0.7 10*3/uL (ref 0.1–1.0)
MONOS PCT: 8 %
Neutro Abs: 6.1 10*3/uL (ref 1.7–7.7)
Neutrophils Relative %: 67 %
PLATELETS: 327 10*3/uL (ref 150–400)
RBC: 4.63 MIL/uL (ref 3.87–5.11)
RDW: 12.3 % (ref 11.5–15.5)
WBC: 9.1 10*3/uL (ref 4.0–10.5)

## 2017-07-15 LAB — COMPREHENSIVE METABOLIC PANEL
ALK PHOS: 61 U/L (ref 38–126)
ALT: 14 U/L (ref 14–54)
AST: 19 U/L (ref 15–41)
Albumin: 4.4 g/dL (ref 3.5–5.0)
Anion gap: 8 (ref 5–15)
BILIRUBIN TOTAL: 0.8 mg/dL (ref 0.3–1.2)
BUN: 11 mg/dL (ref 6–20)
CALCIUM: 9.4 mg/dL (ref 8.9–10.3)
CO2: 26 mmol/L (ref 22–32)
CREATININE: 0.74 mg/dL (ref 0.44–1.00)
Chloride: 105 mmol/L (ref 101–111)
GFR calc Af Amer: >60 mL/min (ref >60)
Glucose, Bld: 112 mg/dL — ABNORMAL HIGH (ref 65–99)
POTASSIUM: 3.5 mmol/L (ref 3.5–5.1)
Sodium: 139 mmol/L (ref 135–145)
TOTAL PROTEIN: 7.5 g/dL (ref 6.5–8.1)

## 2017-07-15 LAB — TSH: TSH: 2.552 u[IU]/mL (ref 0.350–4.500)

## 2017-07-15 LAB — TROPONIN I

## 2017-07-15 LAB — MAGNESIUM: Magnesium: 1.9 mg/dL (ref 1.7–2.4)

## 2017-07-15 MED ORDER — METOPROLOL TARTRATE 5 MG/5ML IV SOLN
5.0000 mg | Freq: Once | INTRAVENOUS | Status: AC
  Administered 2017-07-15: 5 mg via INTRAVENOUS
  Filled 2017-07-15: qty 5

## 2017-07-15 MED ORDER — METOPROLOL TARTRATE 25 MG PO TABS: 12.5000 mg | ORAL_TABLET | Freq: Two times a day (BID) | ORAL | 0 refills | Status: AC | PRN

## 2017-07-15 MED ORDER — IOPAMIDOL (ISOVUE-370) INJECTION 76%
100.0000 mL | Freq: Once | INTRAVENOUS | Status: AC | PRN
  Administered 2017-07-15: 100 mL via INTRAVENOUS

## 2017-07-15 MED ORDER — KETOROLAC TROMETHAMINE 30 MG/ML IJ SOLN
30.0000 mg | Freq: Once | INTRAMUSCULAR | Status: AC
  Administered 2017-07-15: 30 mg via INTRAVENOUS
  Filled 2017-07-15: qty 1

## 2017-07-15 NOTE — ED Notes (Signed)
Ambulated pt after obtaining orthostatic vitals. Tolerated well. Heart rate went up to 156 while ambulating

## 2017-07-15 NOTE — Discharge Instructions (Signed)
Start by taking one half of the metoprolol twice a day as needed if your heart rate is going up over 100.  If that is not controlling your heart rate you can increase it to 25 mg, however monitor your heart rate to make sure it does not get too low such as the low 50s or lower or if it makes you feel lightheaded or dizzy.  Please call the cardiology office to get a follow-up appointment. They have several office locations. You can see which one you can get an appointment with first.  Return to the ED if you feel bad again like you did tonight that doesn't improve after taking a dose of the metoprolol.

## 2017-07-15 NOTE — ED Notes (Signed)
Pt denied any symptoms when being ambulated.

## 2017-07-15 NOTE — ED Notes (Signed)
Ambulated pt. Heart rate went up to 95.

## 2017-07-15 NOTE — ED Provider Notes (Signed)
[facility] EMERGENCY DEPARTMENT Provider Note   CSN: [id number] Arrival date & time: 07/15/17  0135  Time seen 01:54 AM   History   Chief Complaint Chief Complaint  Patient presents with  . Tachycardia    HPI Vicki Ortega is a 31 y.o. female.  HPI patient states she started getting low lower sternal and right-sided chest pain on November 11 that is pleuritic.  She did not have any shortness of breath, cough, or fever.  She states tonight about 1230 she acutely awakened from sleep and felt short of breath.  She states she felt weak and she tried to check her pulse and could not feel it.  She states shortly after that she felt like her heart was racing and she felt dizzy.  She denies feeling nauseated or having vomiting or diaphoresis.  She states after period of time she could hear her hear tbeating hard.  She called EMS however her chain was on her door.  She tried to sit up to go take the chain off the door to let EMS in however when she sat up she felt dizzy and was afraid she would pass out.  She states she feels better now although she feels tired and weak.  She still is having the chest pain.  She denies any pain or swelling of her legs.  She states about 5:30 PM she took ibuprofen and Skelaxin.  She states that 12 years ago she had a cardiac ablation for tachycardia [facility], she cannot tell me what type of tachycardia she had such as Wolff-Parkinson-White or just SVT.  She had cardiology follow-up for about a year and then she was told she did not need anymore follow-up.  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  She denies being on any type of hormone replacement, she denies any recent travel, she denies being sedentary stating she has a 33-year-old who is very active.  She does report a paternal grandmother had DVT and a MI and CABG.  PCP none Past Medical History:  Diagnosis Date  . Normal intrauterine pregnancy in third trimester 09/30/2014  . PCOS (polycystic ovarian  syndrome)   . POTS (postural orthostatic tachycardia syndrome)   . Retained products of conception without hemorrhage 04/26/2013  . SVD (spontaneous vaginal delivery) 10/01/2014  . Tachycardia    postural tachycardia syndrome-had cardiac ablation in 2004  . Thyroid disease     Patient Active Problem List   Diagnosis Date Noted  . Other normal pregnancy, not first 10/01/2014  . SVD (spontaneous vaginal delivery) 10/01/2014  . Normal intrauterine pregnancy in third trimester 09/30/2014    Past Surgical History:  Procedure Laterality Date  . CARDIAC ELECTROPHYSIOLOGY MAPPING AND ABLATION  2004  . DILATION AND EVACUATION N/A 04/27/2013   Procedure: DILATATION AND EVACUATION;  Surgeon: Sherron MondayJody Bovard, MD;  Location: [facility] ORS;  Service: Gynecology;  Laterality: N/A;  . egg retrieval  01/20/13    OB History    Gravida Para Term Preterm AB Living   3 1 1     1    SAB TAB Ectopic Multiple Live Births         0 1       Home Medications    Prior to Admission medications   Medication Sig Start Date End Date Taking? Authorizing Provider  ibuprofen (ADVIL,MOTRIN) 800 MG tablet Take 1 tablet (800 mg total) by mouth every 8 (eight) hours as needed for headache. 10/03/14  Yes Bovard-Stuckert, Augusto GambleJody, MD  metoprolol  tartrate (LOPRESSOR) 25 MG tablet Take 0.5 tablets (12.5 mg total) 2 (two) times daily as needed by mouth (heart rate over 100). 07/15/17   Devoria Albe, MD  oxyCODONE-acetaminophen (PERCOCET/ROXICET) 5-325 MG per tablet Take 1-2 tablets by mouth every 6 (six) hours as needed for severe pain. 10/03/14   Bovard-Stuckert, Augusto Gamble, MD  Prenatal Vit-Fe Fumarate-FA (PRENATAL MULTIVITAMIN) TABS tablet Take 1 tablet by mouth daily at 12 noon. 10/03/14   Bovard-StuckertAugusto Gamble, MD    Family History Family History  Problem Relation Age of Onset  . Diabetes Mother   . Heart attack Paternal Grandfather     Social History Social History   Tobacco Use  . Smoking status: Never Smoker  . Smokeless tobacco:  Never Used  Substance Use Topics  . Alcohol use: No  . Drug use: No  employed as a nurse   Allergies   Patient has no known allergies.   Review of Systems Review of Systems  All other systems reviewed and are negative.    Physical Exam Updated Vital Signs BP 125/71   Pulse 84   Temp 98.8 F (37.1 C) (Oral)   Resp 17   Ht 5\' 4"  (1.626 m)   Wt 61.2 kg (135 lb)   LMP 06/18/2017 (Approximate)   SpO2 100%   BMI 23.17 kg/m   Vital signs normal    Physical Exam  Constitutional: She is oriented to person, place, and time. She appears well-developed and well-nourished.  Non-toxic appearance. She does not appear ill. No distress.  HENT:  Head: Normocephalic and atraumatic.  Right Ear: External ear normal.  Left Ear: External ear normal.  Nose: Nose normal. No mucosal edema or rhinorrhea.  Mouth/Throat: Oropharynx is clear and moist and mucous membranes are normal. No dental abscesses or uvula swelling.  Eyes: Conjunctivae and EOM are normal. Pupils are equal, round, and reactive to light.  Neck: Normal range of motion and full passive range of motion without pain. Neck supple.  Cardiovascular: Regular rhythm. Bradycardia present. Exam reveals no gallop and no friction rub.  Murmur heard. Patient states she has been told she has a heart murmur however she does not do SBE prophylaxis  Pulmonary/Chest: Effort normal and breath sounds normal. No respiratory distress. She has no wheezes. She has no rhonchi. She has no rales. She exhibits no tenderness and no crepitus.  Abdominal: Soft. Normal appearance and bowel sounds are normal. She exhibits no distension. There is no tenderness. There is no rebound and no guarding.  Musculoskeletal: Normal range of motion. She exhibits no edema or tenderness.  Moves all extremities well.   Neurological: She is alert and oriented to person, place, and time. She has normal strength. No cranial nerve deficit.  Skin: Skin is warm, dry and intact.  No rash noted. No erythema. No pallor.  Psychiatric: She has a normal mood and affect. Her speech is normal and behavior is normal. Her mood appears not anxious.  Nursing note and vitals reviewed.    ED Treatments / Results  Labs (all labs ordered are listed, but only abnormal results are displayed) Results for orders placed or performed during the hospital encounter of 07/15/17  Comprehensive metabolic panel  Result Value Ref Range   Sodium 139 135 - 145 mmol/L   Potassium 3.5 3.5 - 5.1 mmol/L   Chloride 105 101 - 111 mmol/L   CO2 26 22 - 32 mmol/L   Glucose, Bld 112 (H) 65 - 99 mg/dL   BUN 11 6 -  20 mg/dL   Creatinine, Ser 9.52 0.44 - 1.00 mg/dL   Calcium 9.4 8.9 - 84.1 mg/dL   Total Protein 7.5 6.5 - 8.1 g/dL   Albumin 4.4 3.5 - 5.0 g/dL   AST 19 15 - 41 U/L   ALT 14 14 - 54 U/L   Alkaline Phosphatase 61 38 - 126 U/L   Total Bilirubin 0.8 0.3 - 1.2 mg/dL   GFR calc non Af Amer >60 >60 mL/min   GFR calc Af Amer >60 >60 mL/min   Anion gap 8 5 - 15  Troponin I  Result Value Ref Range   Troponin I <0.03 <0.03 ng/mL  CBC with Differential  Result Value Ref Range   WBC 9.1 4.0 - 10.5 K/uL   RBC 4.63 3.87 - 5.11 MIL/uL   Hemoglobin 15.2 (H) 12.0 - 15.0 g/dL   HCT 32.4 40.1 - 02.7 %   MCV 97.4 78.0 - 100.0 fL   MCH 32.8 26.0 - 34.0 pg   MCHC 33.7 30.0 - 36.0 g/dL   RDW 25.3 66.4 - 40.3 %   Platelets 327 150 - 400 K/uL   Neutrophils Relative % 67 %   Neutro Abs 6.1 1.7 - 7.7 K/uL   Lymphocytes Relative 24 %   Lymphs Abs 2.2 0.7 - 4.0 K/uL   Monocytes Relative 8 %   Monocytes Absolute 0.7 0.1 - 1.0 K/uL   Eosinophils Relative 1 %   Eosinophils Absolute 0.1 0.0 - 0.7 K/uL   Basophils Relative 0 %   Basophils Absolute 0.0 0.0 - 0.1 K/uL  Magnesium  Result Value Ref Range   Magnesium 1.9 1.7 - 2.4 mg/dL  TSH  Result Value Ref Range   TSH 2.552 0.350 - 4.500 uIU/mL   Laboratory interpretation all normal    EKG  EKG Interpretation  Date/Time:  Friday July 15 2017 01:43:00 EST Ventricular Rate:  112 PR Interval:    QRS Duration: 95 QT Interval:  348 QTC Calculation: 475 R Axis:   112 Text Interpretation:  Sinus tachycardia Right atrial enlargement Consider right ventricular hypertrophy Abnormal lateral Q waves Borderline ST depression, lateral leads No old tracing to compare Confirmed by Devoria Albe ([id number]) on 07/15/2017 1:48:13 AM       Radiology Ct Angio Chest Pe W/cm &/or Wo Cm  Result Date: 07/15/2017 CLINICAL DATA:  Acute onset of shortness of breath and tachycardia. Generalized chest discomfort. EXAM: CT ANGIOGRAPHY CHEST WITH CONTRAST TECHNIQUE: Multidetector CT imaging of the chest was performed using the standard protocol during bolus administration of intravenous contrast. Multiplanar CT image reconstructions and MIPs were obtained to evaluate the vascular anatomy. CONTRAST:  ISOVUE-370 IOPAMIDOL (ISOVUE-370) INJECTION 76% COMPARISON:  None. FINDINGS: Cardiovascular:  There is no evidence of pulmonary embolus. The heart is normal in size. The thoracic aorta is unremarkable. The great vessels are within normal limits. Mediastinum/Nodes: The mediastinum is unremarkable. No mediastinal lymphadenopathy is seen. No pericardial effusion is identified. Lungs/Pleura: Minimal left basilar atelectasis is noted. The lungs are otherwise clear. No focal consolidation, pleural effusion or pneumothorax is seen. No masses are identified. Upper Abdomen: The visualized portions of the liver and spleen are unremarkable. The visualized portions of the adrenal glands and kidneys are within normal limits. Musculoskeletal: No acute osseous abnormalities are identified. The visualized musculature is unremarkable in appearance. Review of the MIP images confirms the above findings. IMPRESSION: 1. No evidence of pulmonary embolus. 2. Minimal left basilar atelectasis noted.  Lungs otherwise clear. Electronically Signed  By: Roanna RaiderJeffery  Chang M.D.   On: 07/15/2017 03:40      Procedures Procedures (including critical care time)  Medications Ordered in ED Medications  iopamidol (ISOVUE-370) 76 % injection 100 mL (100 mLs Intravenous Contrast Given 07/15/17 0302)  ketorolac (TORADOL) 30 MG/ML injection 30 mg (30 mg Intravenous Given 07/15/17 0453)  metoprolol tartrate (LOPRESSOR) injection 5 mg (5 mg Intravenous Given 07/15/17 0557)     Initial Impression / Assessment and Plan / ED Course  I have reviewed the triage vital signs and the nursing notes.  Pertinent labs & imaging results that were available during my care of the patient were reviewed by me and considered in my medical decision making (see chart for details).    Laboratory testing was done to check for any underlying electrolyte abnormality that could make her heart more "irritable" and cause tachycardia including thyroid test.  CT angiogram was done to look for PE with the sudden onset of shortness of breath and tachycardia.  4:15 AM we discussed her test results.  She states she is feeling better.  Her heart rate is in the 70s on the monitor.  I am going to have nursing staff do orthostatic blood pressures and vital signs on her and if she is feeling okay have her walk around the ED and see how she feels.  She states she still having some chest discomfort so she was given IV Toradol.  Orthostatic VS for the past 24 hrs:  BP- Lying Pulse- Lying BP- Sitting Pulse- Sitting BP- Standing at 0 minutes Pulse- Standing at 0 minutes  07/15/17 0447 112/65 80 132/84 106 114/74 114    Orthostatic vital signs show increase in heart rate with standing.  Nurses report patient ambulated around the nurse's desk without difficulty however her heart rate went up to 156.  Patient was given IV Lopressor to help control her heart rate.  When I had gone in to talk to her before her heart rate was in the 70s now it is 110-115 range.  Recheck at 6:50 AM patient's resting heart rate now is 58-70s.  When she was  ambulated by nursing staff her heart rate did not get any higher than the 90s.  She states she feels okay.  We talked about discharging her home on metoprolol, I am going to have her take the lowest dose 12.5 mg however she continues to have a tachycardia she can then take the 25 mg.  I am just afraid she is going to be very sensitive to the beta-blocker may get too bradycardic.  She is interested in referral to a cardiologist for further evaluation.   Final Clinical Impressions(s) / ED Diagnoses   Final diagnoses:  Tachycardia    ED Discharge Orders        Ordered    metoprolol tartrate (LOPRESSOR) 25 MG tablet  2 times daily PRN     07/15/17 0710      Plan discharge  Devoria AlbeIva Joakim Huesman, MD, Concha PyoFACEP    Aquil Duhe, MD 07/15/17 [phone]

## 2017-07-15 NOTE — ED Triage Notes (Signed)
Pt states she awoke with feeling sob and her heart was racing.  Pt reports she had an ablation for tachycardia approx 5 years ago.   Pt states she had some chest discomfort but felt like it was due to a muscle strain.  Pt states she is feeling better at this time.

## 2017-07-18 DIAGNOSIS — O210 Mild hyperemesis gravidarum: Secondary | ICD-10-CM

## 2017-07-18 DIAGNOSIS — R000 Tachycardia, unspecified: Secondary | ICD-10-CM | POA: Insufficient documentation

## 2017-07-18 DIAGNOSIS — A6000 Herpesviral infection of urogenital system, unspecified: Secondary | ICD-10-CM

## 2017-07-18 DIAGNOSIS — O039 Complete or unspecified spontaneous abortion without complication: Secondary | ICD-10-CM | POA: Insufficient documentation

## 2017-07-18 DIAGNOSIS — D72829 Elevated white blood cell count, unspecified: Secondary | ICD-10-CM

## 2017-07-18 HISTORY — DX: Elevated white blood cell count, unspecified: D72.829

## 2017-07-18 HISTORY — DX: Mild hyperemesis gravidarum: O21.0

## 2017-07-18 HISTORY — DX: Herpesviral infection of urogenital system, unspecified: A60.00

## 2017-07-18 HISTORY — DX: Complete or unspecified spontaneous abortion without complication: O03.9

## 2017-07-18 NOTE — Progress Notes (Signed)
Cardiology Office Note:    Date:  07/19/2017   ID:  Vicki Ortega, DOB Oct 21, 1985, MRN [id number]  PCP:  Patient, No Pcp Per  Cardiologist:  Norman Herrlich, MD   Referring MD: No ref. provider found  ASSESSMENT:    1. Tachycardia   2. Abnormal EKG    PLAN:    In order of problems listed above:  1. Clinically I suspect she had recurrent tachyarrhythmia and likely atrial fibrillation.  She is an event monitor for the next 30 days and I placed her on a maintenance small dose of the diuretic.  Her entire presentation was uncommon with severe shortness of breath and chest pain preceding her with a normal CTA there is no reason to pursue the diagnosis of pulmonary embolism.  Her thyroid is normal along with a CBC and there are no provocative medications.  I asked for an echocardiogram to be done with abnormal EKG to exclude underlying structural heart disease.  She can still take small dose of short acting beta blocker as needed.  I attempted to alleviate her anxiety.  Next appointment 4 weeks   Medication Adjustments/Labs and Tests Ordered: Current medicines are reviewed at length with the patient today.  Concerns regarding medicines are outlined above.  Orders Placed This Encounter  Procedures  . Cardiac event monitor  . EKG 12-Lead  . ECHOCARDIOGRAM COMPLETE   Meds ordered this encounter  Medications  . metoprolol succinate (TOPROL-XL) 50 MG 24 hr tablet    Sig: Take 1 tablet (50 mg total) by mouth daily. Take with or immediately following a meal.    Dispense:  90 tablet    Refill:  3     Chief Complaint  Patient presents with  . Tachycardia    ablation done 13 yrs ago    History of Present Illness:    Vicki Ortega is a 31 y.o. female who is being seen today for the evaluation of sinus tachycardia noted in recent ED visit for atypical chest pain. at the request of Devoria Albe, MD.She was previously seen by Earl Gala, MD at [facility] in 2005.  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  Prior to that ablation she had rapid heart rhythms greater than 200 and syncope. Recently she had an ED visit she was awakened during the night with her heart beating forcefully rapidly and she felt as if she would die.  She was very frightened terrified called 911 and when they arrived her heart rate is in the range of 130 sinus tachycardia.  In the emergency room with ambulation her heart rate was in the 50s they gave her a small dose of IV beta-blocker subsequently discharged her.  Prior to this she had lifted her child had soreness in her chest she took ibuprofen and the symptoms resolved in the emergency room her troponin was not detectable and a CTA was performed with no evidence of pulmonary embolism.  She has a heart rate printout from her smart watch and smart phone that showed her rates were in the range of 130-140 at the time of the episode.  Since then she has been emotionally traumatized.  She has a history of abnormal thyroid test in the past but underwent evaluation by endocrine that was normal in the ED she had a  normal TSH.  Emergency room EKG was abnormal with right atrial enlargement right axis deviation but is normalized today.  She takes no over-the-counter proarrhythmic drugs there is no drug usage and does not have sleep apnea.   Past Medical History:  Diagnosis Date  . Abnormal TSH 03/05/2014  . Genital herpes simplex 07/18/2017  . 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 Leukocytosis 07/18/2017  . Miscarriage 07/18/2017  . Morning sickness 07/18/2017  . Normal intrauterine pregnancy in third trimester 09/30/2014  . Other normal pregnancy, not first 10/01/2014  . PCOS (polycystic ovarian syndrome)   . Postpartum state 07/18/2017  . POTS (postural orthostatic  tachycardia syndrome)   . Pregnancy with history of infertility in first trimester 02/17/2013  . Prolonged menstrual cycle 07/19/2012  . Retained products of conception without hemorrhage 04/26/2013  . SVD (spontaneous vaginal delivery) 10/01/2014  . Tachycardia    postural tachycardia syndrome-had cardiac ablation in 2004  . Thyroid disease     Past Surgical History:  Procedure Laterality Date  . CARDIAC ELECTROPHYSIOLOGY MAPPING AND ABLATION  2004  . DILATION AND EVACUATION N/A 04/27/2013   Procedure: DILATATION AND EVACUATION;  Surgeon: Sherron MondayJody Bovard, MD;  Location: [facility];  Service: Gynecology;  Laterality: N/A;  . egg retrieval  01/20/13    Current Medications: Current Meds  Medication Sig  . metoprolol tartrate (LOPRESSOR) 25 MG tablet Take 0.5 tablets (12.5 mg total) 2 (two) times daily as needed by mouth (heart rate over 100).     Allergies:   Patient has no known allergies.   Social History   Socioeconomic History  . Marital status: Married    Spouse name: None  . Number of children: None  . Years of education: None  . Highest education level: None  Social Needs  . Financial resource strain: None  . Food insecurity - worry: None  . Food insecurity - inability: None  . Transportation needs - medical: None  . Transportation needs - non-medical: None  Occupational History  . None  Tobacco Use  . Smoking status: Never Smoker  . Smokeless tobacco: Never Used  Substance and Sexual Activity  . Alcohol use: No  . Drug use: No  . Sexual activity: Yes  Other Topics Concern  . None  Social History Narrative  . None     Family History: The patient's family history includes Diabetes in her mother; Heart attack in her paternal grandfather.  ROS:   ROS Please see the history of present illness.     All other systems reviewed and are negative.  EKGs/Labs/Other Studies Reviewed:    The following studies were reviewed today: CTA: IMPRESSION: 1. No evidence of  pulmonary embolus. 2. Minimal left basilar atelectasis noted.  Lungs otherwise clear. CXR: IMPRESSION: 1. No evidence of pulmonary embolus. 2. Minimal left basilar atelectasis noted.  Lungs otherwise clear. EKG: 07/15/17 showed sinus tachycardia and RAD RAE and diffuse ST depressioin EKG:  EKG is  ordered today.  The ekg ordered today demonstrates sinus rhythm right axis deviation right atrial enlargement has resolved  Recent Labs: 07/15/2017: ALT 14; BUN 11; Creatinine, Ser 0.74; Hemoglobin 15.2; Magnesium 1.9; Platelets 327; Potassium 3.5; Sodium 139; TSH 2.552  Recent Lipid Panel No results found for: CHOL, TRIG, HDL, CHOLHDL, VLDL, LDLCALC, LDLDIRECT  Physical Exam:    VS:  BP (!) 124/92 (BP Location: Left Arm, Patient Position: Sitting, Cuff Size: Normal)   Pulse (!) 103   Ht 5\' 4"  (1.626 m)   Wt 136 lb (  61.7 kg)   SpO2 99%   BMI 23.34 kg/m     Wt Readings from Last 3 Encounters:  07/19/17 136 lb (61.7 kg)  07/15/17 135 lb (61.2 kg)  10/01/14 149 lb (67.6 kg)     GEN: Anxious Well nourished, well developed in no acute distress HEENT: Normal NECK: No JVD; No carotid bruits LYMPHATICS: No lymphadenopathy CARDIAC: RRR, no murmurs, rubs, gallops RESPIRATORY:  Clear to auscultation without rales, wheezing or rhonchi  ABDOMEN: Soft, non-tender, non-distended MUSCULOSKELETAL:  No edema; No deformity  SKIN: Warm and dry NEUROLOGIC:  Alert and oriented x 3 PSYCHIATRIC:  Normal affect     Signed, Norman HerrlichBrian Devinn Voshell, MD  07/19/2017 12:02 PM    [facility]

## 2017-07-19 ENCOUNTER — Encounter: Payer: Self-pay | Admitting: Cardiology

## 2017-07-19 ENCOUNTER — Ambulatory Visit (INDEPENDENT_AMBULATORY_CARE_PROVIDER_SITE_OTHER): Payer: BLUE CROSS/BLUE SHIELD | Admitting: Cardiology

## 2017-07-19 VITALS — BP 124/92 | HR 103 | Ht 64.0 in | Wt 136.0 lb

## 2017-07-19 DIAGNOSIS — R000 Tachycardia, unspecified: Secondary | ICD-10-CM | POA: Diagnosis not present

## 2017-07-19 DIAGNOSIS — R9431 Abnormal electrocardiogram [ECG] [EKG]: Secondary | ICD-10-CM | POA: Insufficient documentation

## 2017-07-19 MED ORDER — METOPROLOL SUCCINATE ER 50 MG PO TB24: 50.0000 mg | ORAL_TABLET | Freq: Every day | ORAL | 3 refills | Status: DC

## 2017-07-19 NOTE — Patient Instructions (Addendum)
Medication Instructions:  Your physician has recommended you make the following change in your medication:  START metoprolol (Toprol XL) 50 mg daily  Labwork: None  Testing/Procedures: You had an EKG today.  Your physician has requested that you have an echocardiogram. Echocardiography is a painless test that uses sound waves to create images of your heart. It provides your doctor with information about the size and shape of your heart and how well your heart's chambers and valves are working. This procedure takes approximately one hour. There are no restrictions for this procedure.  Your physician has recommended that you wear an event monitor. Event monitors are medical devices that record the heart's electrical activity. Doctors most often us these monitors to diagnose arrhythmias. Arrhythmias are problems with the speed or rhythm of the heartbeat. The monitor is a small, portable device. You can wear one while you do your normal daily activities. This is usually used to diagnose what is causing palpitations/syncope (passing out). 1 month.  Follow-Up: Your physician recommends that you schedule a follow-up appointment in: 4 weeks.  Any Other Special Instructions Will Be Listed Below (If Applicable).     If you need a refill on your cardiac medications before your next appointment, please call your pharmacy.    1. Avoid all over-the-counter antihistamines except Claritin/Loratadine and Zyrtec/Cetrizine. 2. Avoid all combination including cold sinus allergies flu decongestant and sleep medications 3. You can use Robitussin DM Mucinex and Mucinex DM for cough. 4. can use Tylenol aspirin ibuprofen and naproxen but no combinations such as sleep or sinus.     Introducing KardiaBand, the new wearable EKG by [employer]. Venetia MaxonKardiaBand replaces your original Apple Watch band. The first of its kind, FDA-cleared KardiaBand provides accurate and instant analysis for detecting atrial fibrillation  (AF) and normal sinus rhythm in an EKG. Simply place your thumb on the integrated KardiaBand sensor to take a medical-grade EKG in just 30 seconds. Results appear instantly on your Apple Watch. Venetia MaxonKardiaBand is available today for just $199. KardiaBand features are designed exclusively for use with [employer] Premium membership - $99 year. The [employer]Kardia Premium app for [employer] Watch includes [employer]'s revolutionary SmartRhythm monitoring feature. SmartRhythm monitoring uses an intelligent neural network that runs directly on the [employer] Watch, constantly acquiring data from the watch's heart rate sensor and its accelerometer. SmartRhythm compares your heart rate to what it expects from your minute-by-minute level of activity. When the network sees a pattern of heart rate and activity that it does not expect, it notifies you to take an EKG. With [place]ardiaBand, peace of mind is just an EKG away.  Mayford Knife.  KardiaMobile [email or web address]        FDA-cleared, clinical grade mobile EKG monitor: Lourena SimmondsKardia is the most clinically-validated mobile EKG used by the world's leading cardiac care medical professionals With Basic service, know instantly if your heart rhythm is normal or if atrial fibrillation is detected, and email the last single EKG recording to yourself or your doctor Premium service, available for purchase through the Kardia app for $9.99 per month or $99 per year, includes unlimited history and storage of your EKG recordings, a monthly EKG summary report to share with your doctor, along with the ability to track your blood pressure, activity and weight Includes one KardiaMobile phone clip FREE SHIPPING: Standard delivery 1-3 business days. Orders placed by 11:00am PST will ship that afternoon. Otherwise, will ship next business day. All orders ship via [employer] Priority Mail [place]

## 2017-08-12 ENCOUNTER — Ambulatory Visit (HOSPITAL_BASED_OUTPATIENT_CLINIC_OR_DEPARTMENT_OTHER)
Admission: RE | Admit: 2017-08-12 | Discharge: 2017-08-12 | Disposition: A | Discharge status: Home / Self Care | Payer: BLUE CROSS/BLUE SHIELD | Source: Ambulatory Visit | Attending: Cardiology | Admitting: Cardiology

## 2017-08-12 ENCOUNTER — Ambulatory Visit: Payer: BLUE CROSS/BLUE SHIELD

## 2017-08-12 DIAGNOSIS — R000 Tachycardia, unspecified: Secondary | ICD-10-CM | POA: Diagnosis not present

## 2017-08-12 NOTE — Progress Notes (Signed)
Echocardiogram 2D Echocardiogram has been performed.  Dorothey BasemanReel, Aracelli Woloszyn M 08/12/2017, 3:54 PM

## 2017-08-15 NOTE — Progress Notes (Signed)
Cardiology Office Note:    Date:  08/17/2017   ID:  Vicki Ortega, DOB 1985/10/01, MRN [id number]  PCP:  Patient, No Pcp Per  Cardiologist:  Norman HerrlichBrian Kadin Canipe, MD    Referring MD: No ref. provider found    ASSESSMENT:    1. Tachycardia    PLAN:    In order of problems listed above:  1. She is improved and tolerates her beta-blocker without side effect.  She has had no recurrent episodes of palpitation.  She has purchased a English as a second language teacheriPhone adapter and has not recorded any symptomatic events.  Echocardiogram shows a structurally normal heart at this point time we will continue beta-blocker therapy and if she has breakthrough episodes of SVT will be referred to my EP colleagues.  She will avoid over-the-counter proarrhythmic drugs   Next appointment: 6 months   Medication Adjustments/Labs and Tests Ordered: Current medicines are reviewed at length with the patient today.  Concerns regarding medicines are outlined above.  No orders of the defined types were placed in this encounter.  No orders of the defined types were placed in this encounter.   Chief Complaint  Patient presents with  . Follow-up    Echo  . Tachycardia    History of Present Illness:    Vicki Osierara Shorey is a 31 y.o. female with a hx of SVT with EP ablation in 2005 and sinus tachycardia noted in recent ED visit for atypical chest pain  last seen 1 month ago. Compliance with diet, lifestyle and medications: Yes She tolerates her beta-blocker feels improved and has had no recurrent episodes of palpitation.  She is reassured by normal echocardiogram is purchased not phone adapter to record EKG. Past Medical History:  Diagnosis Date  . Abnormal TSH 03/05/2014  . Genital herpes simplex 07/18/2017  . 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 Leukocytosis 07/18/2017  . Miscarriage 07/18/2017  . Morning sickness 07/18/2017  . Normal intrauterine pregnancy in third trimester 09/30/2014  . Other normal pregnancy, not first  10/01/2014  . PCOS (polycystic ovarian syndrome)   . Postpartum state 07/18/2017  . POTS (postural orthostatic tachycardia syndrome)   . Pregnancy with history of infertility in first trimester 02/17/2013  . Prolonged menstrual cycle 07/19/2012  . Retained products of conception without hemorrhage 04/26/2013  . SVD (spontaneous vaginal delivery) 10/01/2014  . Tachycardia    postural tachycardia syndrome-had cardiac ablation in 2004  . Thyroid disease     Past Surgical History:  Procedure Laterality Date  . CARDIAC ELECTROPHYSIOLOGY MAPPING AND ABLATION  2004  . DILATION AND EVACUATION N/A 04/27/2013   Procedure: DILATATION AND EVACUATION;  Surgeon: Sherron MondayJody Bovard, MD;  Location: [facility];  Service: Gynecology;  Laterality: N/A;  . egg retrieval  01/20/13    Current Medications: Current Meds  Medication Sig  . metoprolol succinate (TOPROL-XL) 50 MG 24 hr tablet Take 1 tablet (50 mg total) by mouth daily. Take with or immediately following a meal.  . metoprolol tartrate (LOPRESSOR) 25 MG tablet Take 0.5 tablets (12.5 mg total) 2 (two) times daily as needed by mouth (heart rate over 100).     Allergies:   Patient has no known allergies.   Social History   Socioeconomic History  . Marital status: Married    Spouse name: None  . Number of children: None  . Years of education: None  . Highest education level: None  Social Needs  . Financial resource strain: None  . Food insecurity - worry: None  . Food insecurity -  inability: None  . Transportation needs - medical: None  . Transportation needs - non-medical: None  Occupational History  . None  Tobacco Use  . Smoking status: Never Smoker  . Smokeless tobacco: Never Used  Substance and Sexual Activity  . Alcohol use: No  . Drug use: No  . Sexual activity: Yes  Other Topics Concern  . None  Social History Narrative  . None     Family History: The patient's family history includes Diabetes in her mother; Heart attack in her  paternal grandfather. ROS:   Please see the history of present illness.    All other systems reviewed and are negative.  EKGs/Labs/Other Studies Reviewed:    The following studies were reviewed today:  Echo: Left ventricle:  The cavity size was normal. Wall thickness was normal. Systolic function was normal. The estimated ejection fraction was in the range of 60% to 65%. Wall motion was normal; there were no regional wall motion abnormalities. The transmitral flow pattern was normal. Left ventricular diastolic function parameters were normal.  Recent Labs: 07/15/2017: ALT 14; BUN 11; Creatinine, Ser 0.74; Hemoglobin 15.2; Magnesium 1.9; Platelets 327; Potassium 3.5; Sodium 139; TSH 2.552  Recent Lipid Panel No results found for: CHOL, TRIG, HDL, CHOLHDL, VLDL, LDLCALC, LDLDIRECT  Physical Exam:    VS:  BP 130/80 (BP Location: Right Arm, Patient Position: Sitting, Cuff Size: Normal)   Pulse 65   Ht 5\' 4"  (1.626 m)   Wt 136 lb (61.7 kg)   SpO2 100%   BMI 23.34 kg/m     Wt Readings from Last 3 Encounters:  08/16/17 136 lb (61.7 kg)  07/19/17 136 lb (61.7 kg)  07/15/17 135 lb (61.2 kg)     GEN:  Well nourished, well developed in no acute distress HEENT: Normal NECK: No JVD; No carotid bruits LYMPHATICS: No lymphadenopathy CARDIAC: RRR, no murmurs, rubs, gallops RESPIRATORY:  Clear to auscultation without rales, wheezing or rhonchi  ABDOMEN: Soft, non-tender, non-distended MUSCULOSKELETAL:  No edema; No deformity  SKIN: Warm and dry NEUROLOGIC:  Alert and oriented x 3 PSYCHIATRIC:  Normal affect    Signed, Norman HerrlichBrian Cinnamon Morency, MD  08/17/2017 7:36 AM    [facility]

## 2017-08-16 ENCOUNTER — Ambulatory Visit (INDEPENDENT_AMBULATORY_CARE_PROVIDER_SITE_OTHER): Payer: BLUE CROSS/BLUE SHIELD | Admitting: Cardiology

## 2017-08-16 ENCOUNTER — Encounter: Payer: Self-pay | Admitting: Cardiology

## 2017-08-16 VITALS — BP 130/80 | HR 65 | Ht 64.0 in | Wt 136.0 lb

## 2017-08-16 DIAGNOSIS — R000 Tachycardia, unspecified: Secondary | ICD-10-CM

## 2017-08-16 NOTE — Patient Instructions (Signed)

## 2018-04-14 IMAGING — CT CT ANGIO CHEST
2 of 6 series · 19 of 36 positions shown · IV contrast (Isovue)
Comparison: None.

CLINICAL DATA: Acute onset of shortness of breath and tachycardia.
Generalized chest discomfort.

EXAM:
CT ANGIOGRAPHY CHEST WITH CONTRAST
TECHNIQUE: Multidetector CT imaging of the chest was performed using the
standard protocol during bolus administration of intravenous
contrast. Multiplanar CT image reconstructions and MIPs were
obtained to evaluate the vascular anatomy.
CONTRAST:  100mL A6NKP3-TM8 IOPAMIDOL (A6NKP3-TM8) INJECTION 76%

[Series 5: thins · axial · 0.60mm/px · z∈[-383,-128]mm · 18 of 285 slices shown]
[im 15/285  lung]
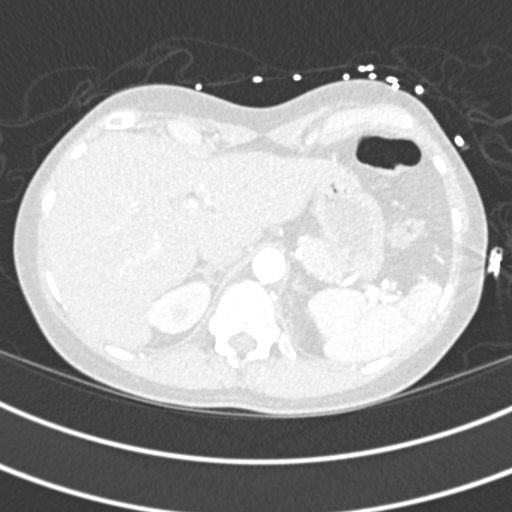
[im 29/285  mediastinal]
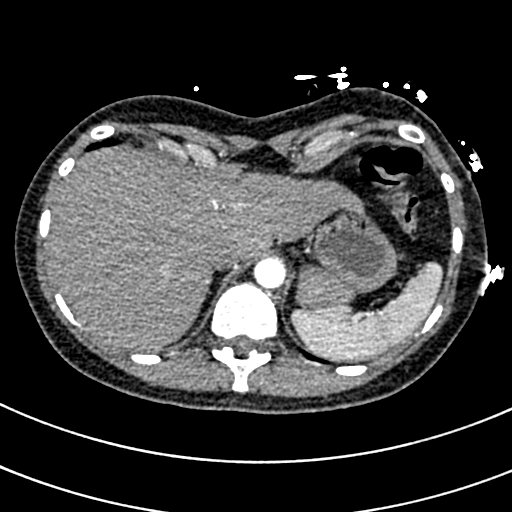
[im 43/285  lung]
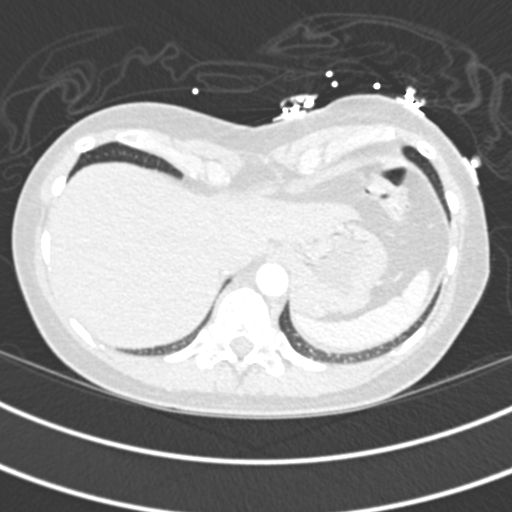
[im 57/285  mediastinal]
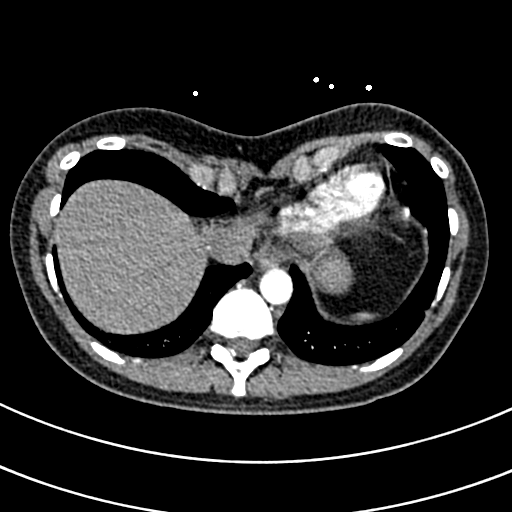
[im 72/285  lung]
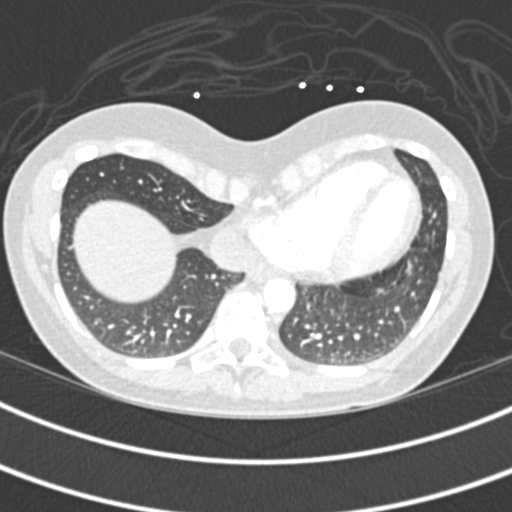
[im 86/285  mediastinal]
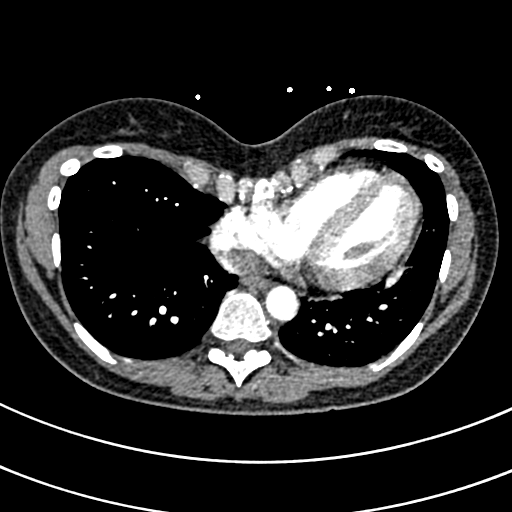
[im 100/285  lung]
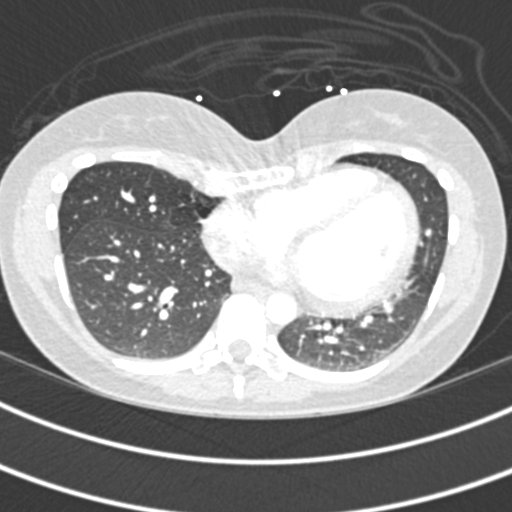
[im 114/285  mediastinal]
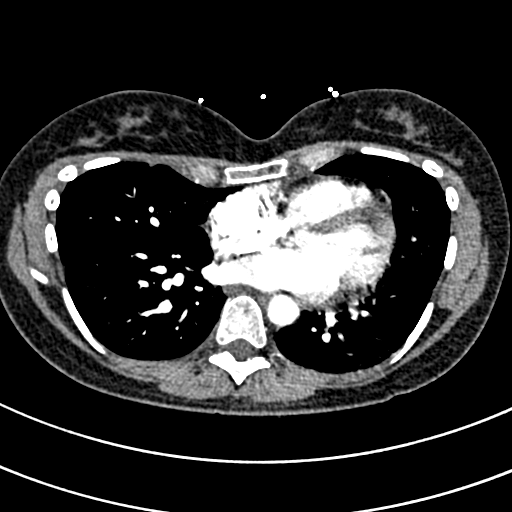
[im 128/285  lung]
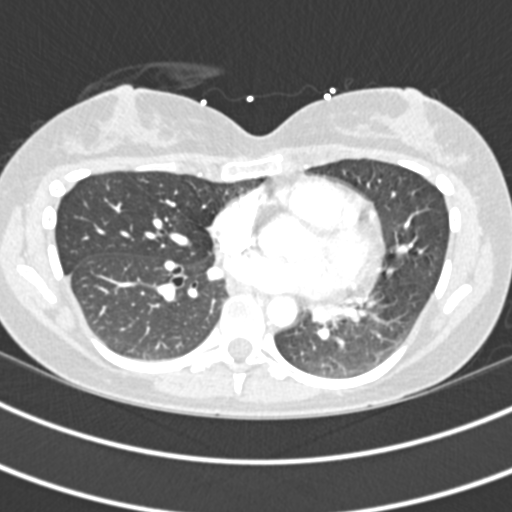
[im 157/285  mediastinal]
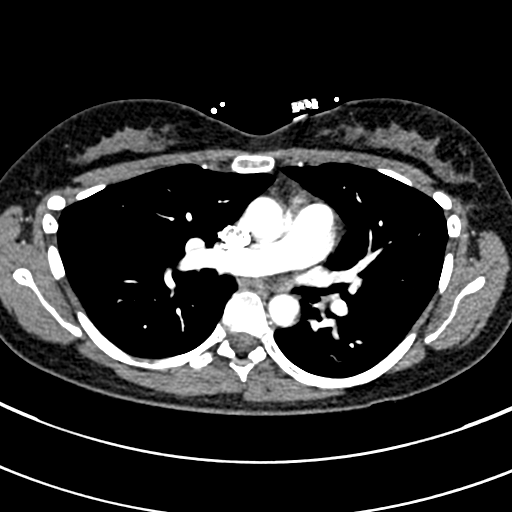
[im 171/285  lung]
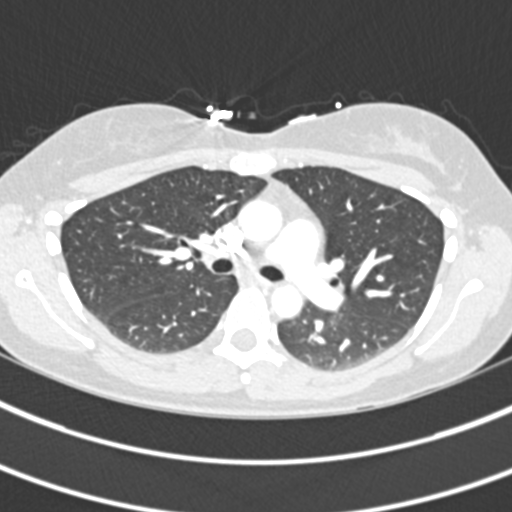
[im 185/285  mediastinal]
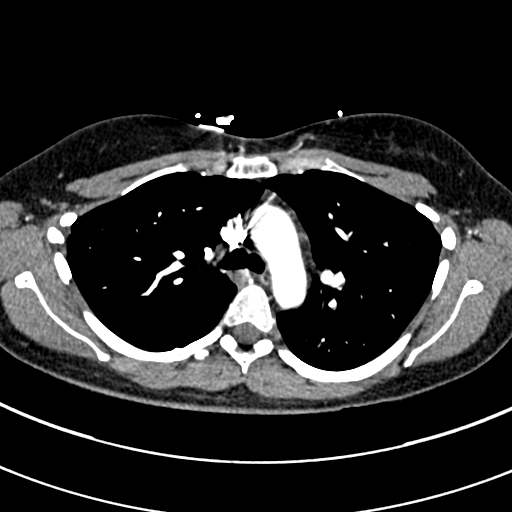
[im 199/285  lung]
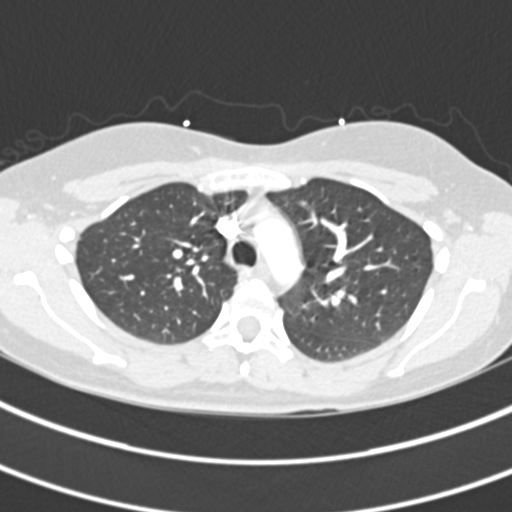
[im 214/285  mediastinal]
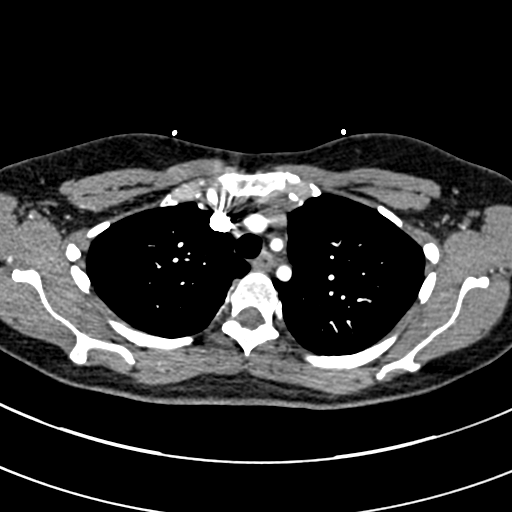
[im 228/285  lung]
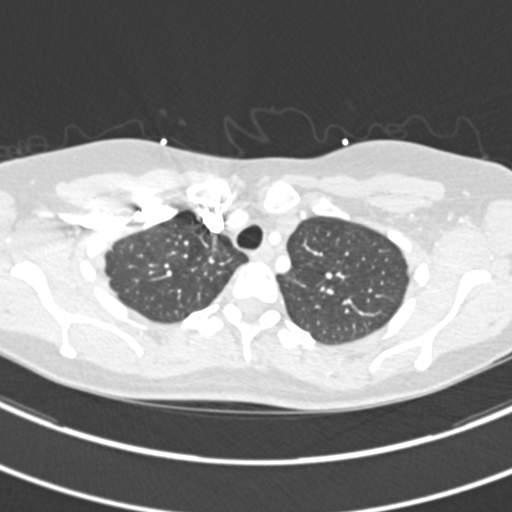
[im 242/285  mediastinal]
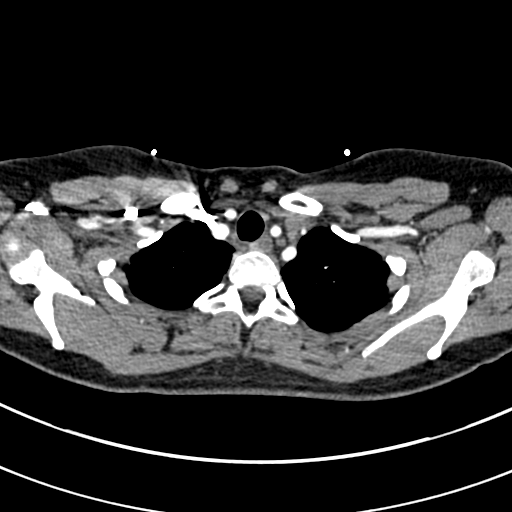
[im 256/285  lung]
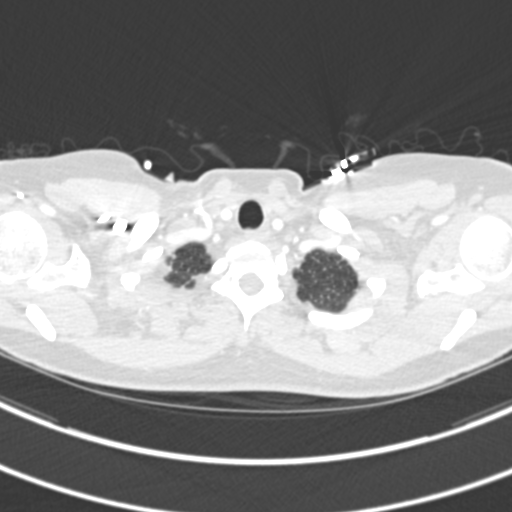
[im 270/285  mediastinal]
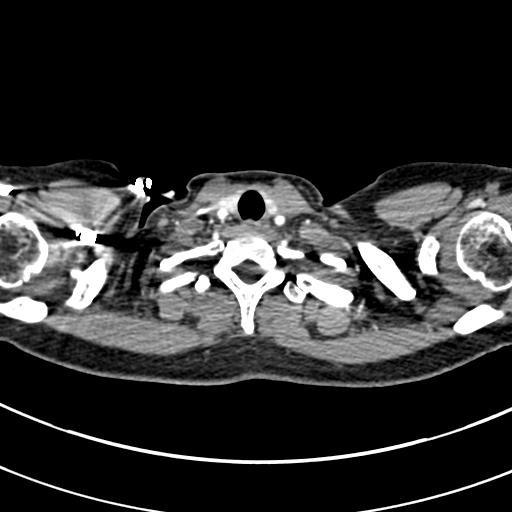

[Series 7: coronal mpr · coronal · 0.61mm/px · 1 of 96 slices shown]
[im 48/96  mediastinal]
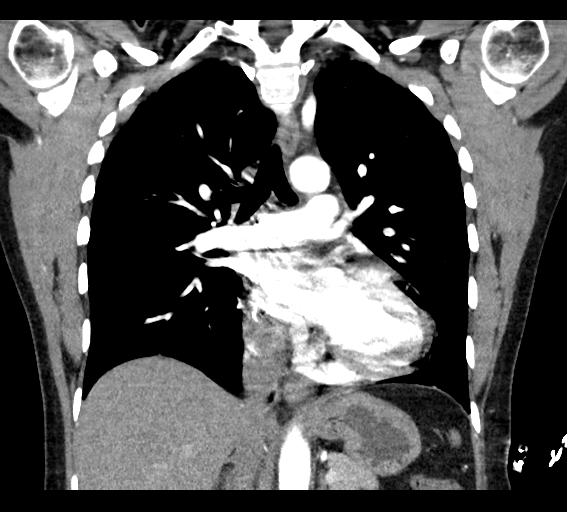

[19 of 36 positions shown; findings below may reference images not displayed]

FINDINGS: Cardiovascular:  There is no evidence of pulmonary embolus.

The heart is normal in size. The thoracic aorta is unremarkable. The
great vessels are within normal limits.

Mediastinum/Nodes: The mediastinum is unremarkable. No mediastinal
lymphadenopathy is seen. No pericardial effusion is identified.

Lungs/Pleura: Minimal left basilar atelectasis is noted. The lungs
are otherwise clear. No focal consolidation, pleural effusion or
pneumothorax is seen. No masses are identified.

Upper Abdomen: The visualized portions of the liver and spleen are
unremarkable. The visualized portions of the adrenal glands and
kidneys are within normal limits.

Musculoskeletal: No acute osseous abnormalities are identified. The
visualized musculature is unremarkable in appearance.

Review of the MIP images confirms the above findings.
IMPRESSION: 1. No evidence of pulmonary embolus.
2. Minimal left basilar atelectasis noted.  Lungs otherwise clear.

## 2018-05-31 NOTE — Progress Notes (Signed)
Cardiology Office Note:    Date:  06/01/2018   ID:  Vicki Ortega, DOB 1986-06-01, MRN [id number]  PCP:  Patient, No Pcp Per  Cardiologist:  Norman Herrlich, MD    Referring MD: No ref. provider found    ASSESSMENT:    1. Tachycardia   2. SVT (supraventricular tachycardia) (HCC)    PLAN:    In order of problems listed above:  1. She has done well with long-acting beta-blocker without any episodes of rapid heart rhythm or palpitation she has a smart watch with a heart rate monitor was concerned with some rates in the 30s while she was sleeping had stopped her beta-blocker symptoms flared and she is back now with heart rates above 40 at nighttime and rates in the range of 60-80 during the day I told her to continue her long-acting beta-blocker if she felt the need she can reduce the dose by 50%. 2. Stable no recurrence she needs to stay on a beta-blocker for sinus tachycardia and palpitation   Next appointment: 1 year   Medication Adjustments/Labs and Tests Ordered: Current medicines are reviewed at length with the patient today.  Concerns regarding medicines are outlined above.  No orders of the defined types were placed in this encounter.  Meds ordered this encounter  Medications  . metoprolol succinate (TOPROL-XL) 50 MG 24 hr tablet    Sig: Take 1 tablet (50 mg total) by mouth daily. Take with or immediately following a meal.    Dispense:  90 tablet    Refill:  3    Chief Complaint  Patient presents with  . Follow-up    for rapid HR    History of Present Illness:    Vicki Ortega is a 32 y.o. female with a hx of  SVT with EP ablation in 2005 and sinus tachycardia last seen 08/16/17. Compliance with diet, lifestyle and medications: yes  She is done well with the beta-blocker is alleviated symptoms daytime heart rates from the 60s to 80s she has had some nighttime heart rates in the 30s and I told her this is within a heart rate variability of normal individuals on her own  she can decrease the beta-blocker dose to 50% if she would like to avoid over-the-counter proarrhythmic single see back in the office in 1 year no chest pain syncope shortness of breath she also avoid over-the-counter proarrhythmic drugs she has an upper respiratory infection I gave her an updated list today Past Medical History:  Diagnosis Date  . Abnormal TSH 03/05/2014  . Genital herpes simplex 07/18/2017  . 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 Leukocytosis 07/18/2017  . Miscarriage 07/18/2017  . Morning sickness 07/18/2017  . Normal intrauterine pregnancy in third trimester 09/30/2014  . Other normal pregnancy, not first 10/01/2014  . PCOS (polycystic ovarian syndrome)   . Postpartum state 07/18/2017  . POTS (postural orthostatic tachycardia syndrome)   . Pregnancy with history of infertility in first trimester 02/17/2013  . Prolonged menstrual cycle 07/19/2012  . Retained products of conception without hemorrhage 04/26/2013  . SVD (spontaneous vaginal delivery) 10/01/2014  . Tachycardia    postural tachycardia syndrome-had cardiac ablation in 2004  . Thyroid disease     Past Surgical History:  Procedure Laterality Date  . CARDIAC ELECTROPHYSIOLOGY MAPPING AND ABLATION  2004  . DILATION AND EVACUATION N/A 04/27/2013   Procedure: DILATATION AND EVACUATION;  Surgeon: Sherron Monday, MD;  Location: [facility];  Service: Gynecology;  Laterality: N/A;  . egg retrieval  01/20/13    Current Medications: No outpatient medications have been marked as taking for the 06/01/18 encounter (Office Visit) with Baldo Daub, MD.     Allergies:   Patient has no known allergies.   Social History   Socioeconomic History  . Marital status: Married    Spouse name: Not on file  . Number of children: Not on file  . Years of education: Not on file  . Highest education level: Not on file  Occupational History  . Not on file  Social Needs  . Financial resource strain: Not on file  . Food  insecurity:    Worry: Not on file    Inability: Not on file  . Transportation needs:    Medical: Not on file    Non-medical: Not on file  Tobacco Use  . Smoking status: Never Smoker  . Smokeless tobacco: Never Used  Substance and Sexual Activity  . Alcohol use: No  . Drug use: No  . Sexual activity: Yes  Lifestyle  . Physical activity:    Days per week: Not on file    Minutes per session: Not on file  . Stress: Not on file  Relationships  . Social connections:    Talks on phone: Not on file    Gets together: Not on file    Attends religious service: Not on file    Active member of club or organization: Not on file    Attends meetings of clubs or organizations: Not on file    Relationship status: Not on file  Other Topics Concern  . Not on file  Social History Narrative  . Not on file     Family History: The patient's family history includes Diabetes in her mother; Heart attack in her paternal grandfather. ROS:   Please see the history of present illness.    All other systems reviewed and are negative.  EKGs/Labs/Other Studies Reviewed:    The following studies were reviewed today  Recent Labs: 07/15/2017: ALT 14; BUN 11; Creatinine, Ser 0.74; Hemoglobin 15.2; Magnesium 1.9; Platelets 327; Potassium 3.5; Sodium 139; TSH 2.552  Recent Lipid Panel No results found for: CHOL, TRIG, HDL, CHOLHDL, VLDL, LDLCALC, LDLDIRECT  Physical Exam:    VS:  BP 132/78 (BP Location: Right Arm, Patient Position: Sitting, Cuff Size: Normal)   Pulse 69   Ht 5\' 4"  (1.626 m)   Wt 135 lb 1.9 oz (61.3 kg)   SpO2 99%   BMI 23.19 kg/m     Wt Readings from Last 3 Encounters:  06/01/18 135 lb 1.9 oz (61.3 kg)  08/16/17 136 lb (61.7 kg)  07/19/17 136 lb (61.7 kg)     GEN:  Well nourished, well developed in no acute distress HEENT: Normal NECK: No JVD; No carotid bruits LYMPHATICS: No lymphadenopathy CARDIAC: RRR, no murmurs, rubs, gallops RESPIRATORY:  Clear to auscultation  without rales, wheezing or rhonchi  ABDOMEN: Soft, non-tender, non-distended MUSCULOSKELETAL:  No edema; No deformity  SKIN: Warm and dry NEUROLOGIC:  Alert and oriented x 3 PSYCHIATRIC:  Normal affect    Signed, Norman Herrlich, MD  06/01/2018 9:46 AM    [facility]

## 2018-06-01 ENCOUNTER — Ambulatory Visit: Payer: BLUE CROSS/BLUE SHIELD | Admitting: Cardiology

## 2018-06-01 VITALS — BP 132/78 | HR 69 | Ht 64.0 in | Wt 135.1 lb

## 2018-06-01 DIAGNOSIS — I471 Supraventricular tachycardia: Secondary | ICD-10-CM | POA: Diagnosis not present

## 2018-06-01 DIAGNOSIS — R000 Tachycardia, unspecified: Secondary | ICD-10-CM

## 2018-06-01 MED ORDER — METOPROLOL SUCCINATE ER 50 MG PO TB24: 50.0000 mg | ORAL_TABLET | Freq: Every day | ORAL | 3 refills | Status: AC

## 2018-06-01 NOTE — Patient Instructions (Addendum)
Medication Instructions:  Your physician recommends that you continue on your current medications as directed. Please refer to the Current Medication list given to you today.   Labwork: None  Testing/Procedures: None  Follow-Up: Your physician wants you to follow-up in: 1 year. You will receive a reminder letter in the mail two months in advance. If you don't receive a letter, please call our office to schedule the follow-up appointment.   If you need a refill on your cardiac medications before your next appointment, please call your pharmacy.   Thank you for choosing [facility]! Catherine Lockhart, RN [phone]      1. Avoid all over-the-counter antihistamines except Claritin/Loratadine and Zyrtec/Cetrizine. 2. Avoid all combination including cold sinus allergies flu decongestant and sleep medications 3. You can use Robitussin DM Mucinex and Mucinex DM for cough. 4. can use Tylenol aspirin ibuprofen and naproxen but no combinations such as sleep or sinus. 

## 2024-02-15 ENCOUNTER — Other Ambulatory Visit (HOSPITAL_COMMUNITY): Payer: Self-pay | Admitting: Obstetrics and Gynecology

## 2024-02-15 ENCOUNTER — Encounter (HOSPITAL_COMMUNITY): Payer: Self-pay | Admitting: Obstetrics and Gynecology

## 2024-02-15 DIAGNOSIS — O021 Missed abortion: Secondary | ICD-10-CM

## 2024-02-15 NOTE — Progress Notes (Signed)
 Spoke w/ via phone for pre-op interview--- Vicki Ortega needs dos----  NONE. Surgeon orders requested 02/15/24.       Ortega results------ COVID test -----patient states asymptomatic no test needed Arrive at -------1100 NPO after MN NO Solid Food.  Clear liquids from MN until---1000 Pre-Surgery Ensure or G2:  Med rec completed Medications to take morning of surgery -----NONE Diabetic medication -----  GLP1 agonist last dose: GLP1 instructions:  Patient instructed no nail polish to be worn day of surgery Patient instructed to bring photo id and insurance card day of surgery Patient aware to have Driver (ride ) / caregiver    for 24 hours after surgery - Husband Vicki Ortega Patient Special Instructions ----- Shower with antibacterial soap Pre-Op special Instructions -----  Patient verbalized understanding of instructions that were given at this phone interview. Patient denies chest pain, sob, fever, cough at the interview.

## 2024-02-16 ENCOUNTER — Encounter (HOSPITAL_COMMUNITY): Admission: RE | Payer: Self-pay | Source: Home / Self Care

## 2024-02-16 ENCOUNTER — Ambulatory Visit (HOSPITAL_COMMUNITY): Admission: RE | Admit: 2024-02-16 | Source: Ambulatory Visit

## 2024-02-16 ENCOUNTER — Ambulatory Visit (HOSPITAL_COMMUNITY): Admission: RE | Admit: 2024-02-16 | Source: Home / Self Care | Admitting: Obstetrics and Gynecology

## 2024-02-16 DIAGNOSIS — Z01818 Encounter for other preprocedural examination: Secondary | ICD-10-CM

## 2024-02-16 SURGERY — DILATION AND EVACUATION, UTERUS
Anesthesia: Choice

## 2024-02-19 ENCOUNTER — Emergency Department (HOSPITAL_BASED_OUTPATIENT_CLINIC_OR_DEPARTMENT_OTHER)

## 2024-02-19 ENCOUNTER — Other Ambulatory Visit: Payer: Self-pay

## 2024-02-19 ENCOUNTER — Encounter (HOSPITAL_BASED_OUTPATIENT_CLINIC_OR_DEPARTMENT_OTHER): Payer: Self-pay | Admitting: Emergency Medicine

## 2024-02-19 ENCOUNTER — Emergency Department (HOSPITAL_BASED_OUTPATIENT_CLINIC_OR_DEPARTMENT_OTHER)
Admission: EM | Admit: 2024-02-19 | Discharge: 2024-02-19 | Disposition: A | Discharge status: Home / Self Care | Attending: Emergency Medicine | Admitting: Emergency Medicine

## 2024-02-19 ENCOUNTER — Emergency Department (HOSPITAL_BASED_OUTPATIENT_CLINIC_OR_DEPARTMENT_OTHER): Admitting: Radiology

## 2024-02-19 DIAGNOSIS — O039 Complete or unspecified spontaneous abortion without complication: Secondary | ICD-10-CM | POA: Diagnosis present

## 2024-02-19 DIAGNOSIS — O26891 Other specified pregnancy related conditions, first trimester: Secondary | ICD-10-CM | POA: Insufficient documentation

## 2024-02-19 DIAGNOSIS — R0789 Other chest pain: Secondary | ICD-10-CM | POA: Insufficient documentation

## 2024-02-19 DIAGNOSIS — Z3A01 Less than 8 weeks gestation of pregnancy: Secondary | ICD-10-CM | POA: Insufficient documentation

## 2024-02-19 HISTORY — DX: Other pulmonary embolism without acute cor pulmonale: I26.99

## 2024-02-19 LAB — BASIC METABOLIC PANEL WITH GFR
Anion gap: 16 — ABNORMAL HIGH (ref 5–15)
BUN: 10 mg/dL (ref 6–20)
CO2: 21 mmol/L — ABNORMAL LOW (ref 22–32)
Calcium: 10.2 mg/dL (ref 8.9–10.3)
Chloride: 102 mmol/L (ref 98–111)
Creatinine, Ser: 0.78 mg/dL (ref 0.44–1.00)
GFR, Estimated: >60 mL/min (ref >60)
Glucose, Bld: 108 mg/dL — ABNORMAL HIGH (ref 70–99)
Potassium: 3.6 mmol/L (ref 3.5–5.1)
Sodium: 139 mmol/L (ref 135–145)

## 2024-02-19 LAB — TROPONIN T, HIGH SENSITIVITY
Troponin T High Sensitivity: <15 ng/L (ref <19)
Troponin T High Sensitivity: <15 ng/L (ref <19)

## 2024-02-19 LAB — PROTIME-INR
INR: 1 (ref 0.8–1.2)
Prothrombin Time: 13.4 s (ref 11.4–15.2)

## 2024-02-19 LAB — HCG, QUANTITATIVE, PREGNANCY: hCG, Beta Chain, Quant, S: 1206 m[IU]/mL — ABNORMAL HIGH (ref <5)

## 2024-02-19 LAB — CBC
HCT: 42.6 % (ref 36.0–46.0)
Hemoglobin: 14.6 g/dL (ref 12.0–15.0)
MCH: 32.3 pg (ref 26.0–34.0)
MCHC: 34.3 g/dL (ref 30.0–36.0)
MCV: 94.2 fL (ref 80.0–100.0)
Platelets: 401 10*3/uL — ABNORMAL HIGH (ref 150–400)
RBC: 4.52 MIL/uL (ref 3.87–5.11)
RDW: 12.8 % (ref 11.5–15.5)
WBC: 9.5 10*3/uL (ref 4.0–10.5)
nRBC: 0 % (ref 0.0–0.2)

## 2024-02-19 MED ORDER — IOHEXOL 350 MG/ML SOLN
75.0000 mL | Freq: Once | INTRAVENOUS | Status: AC | PRN
  Administered 2024-02-19: 75 mL via INTRAVENOUS

## 2024-02-19 MED ORDER — IOPAMIDOL (ISOVUE-370) INJECTION 76%: 75.0000 mL | Freq: Once | INTRAVENOUS | Status: DC | PRN

## 2024-02-19 NOTE — Discharge Instructions (Signed)
 Follow back up with your OB/GYN.  Today's workup very reassuring no evidence of any blood clots in the lungs.  No evidence of any blood clot in the left upper extremity.  And your heart markers were normal so no concern for any acute cardiac problem.  Would continue the Lovenox until OB/GYN says to stop.  Your quantitative hCG here today was 1200.  So significantly down from the 7000.

## 2024-02-19 NOTE — ED Provider Notes (Addendum)
 [place] EMERGENCY DEPARTMENT AT [facility] Provider Note   CSN: [id number] Arrival date & time: 02/19/24  9192     Patient presents with: Chest Pain   Vicki Ortega is a 38 y.o. female.   Patient currently pregnant states she is undergoing miscarriage.  This morning she had left arm discomfort reminded her when she had a left upper extremity DVT.  And she has had some chest discomfort.  Concerned that she is had another pulmonary embolus.  She had such problem 3 years ago.  Patient still on Lovenox on half dose.  Patient's oxygen saturation is reassuring at 100% and blood pressure is good at 169/80.  Heart rate up a little bit at 104.  EKG without acute changes.       Prior to Admission medications   Medication Sig Start Date End Date Taking? Authorizing Provider  enoxaparin (LOVENOX) 40 MG/0.4ML injection Inject 40 mg into the skin daily. PATIENT STATED LAST DOSE WAS 02/14/24 PER SURGEON INSTRUCTIONS.    [provider]  metoprolol  succinate (TOPROL -XL) 50 MG 24 hr tablet Take 1 tablet (50 mg total) by mouth daily. Take with or immediately following a meal. Patient not taking: Reported on 02/15/2024 06/01/18 08/30/18  Monetta Redell PARAS, MD  metoprolol  tartrate (LOPRESSOR ) 25 MG tablet Take 0.5 tablets (12.5 mg total) 2 (two) times daily as needed by mouth (heart rate over 100). Patient not taking: Reported on 02/15/2024 07/15/17   Knapp, Iva, MD    Allergies: Cytotec [misoprostol]    Review of Systems  Constitutional:  Negative for chills and fever.  HENT:  Negative for ear pain and sore throat.   Eyes:  Negative for pain and visual disturbance.  Respiratory:  Negative for cough and shortness of breath.   Cardiovascular:  Positive for chest pain. Negative for palpitations.  Gastrointestinal:  Negative for abdominal pain and vomiting.  Genitourinary:  Negative for dysuria and hematuria.  Musculoskeletal:  Negative for arthralgias and back pain.  Skin:  Negative for  color change and rash.  Neurological:  Negative for seizures and syncope.  All other systems reviewed and are negative.   Updated Vital Signs BP (!) 169/80   Pulse (!) 106   Temp 98.3 F (36.8 C) (Oral)   Resp 18   LMP  (LMP Unknown) Comment: currently miscarrying, was 7 weeks  SpO2 100%   Physical Exam Vitals and nursing note reviewed.  Constitutional:      General: She is not in acute distress.    Appearance: Normal appearance. She is well-developed.  HENT:     Head: Normocephalic and atraumatic.   Eyes:     Extraocular Movements: Extraocular movements intact.     Conjunctiva/sclera: Conjunctivae normal.     Pupils: Pupils are equal, round, and reactive to light.    Cardiovascular:     Rate and Rhythm: Normal rate and regular rhythm.     Heart sounds: No murmur heard. Pulmonary:     Effort: Pulmonary effort is normal. No respiratory distress.     Breath sounds: Normal breath sounds. No wheezing or rales.  Abdominal:     Palpations: Abdomen is soft.     Tenderness: There is no abdominal tenderness.   Musculoskeletal:        General: No swelling, tenderness, deformity or signs of injury.     Cervical back: Neck supple.     Comments: Questionable slight swelling to left upper extremity.  Radial pulse 2+.  Neurovascularly intact.   Skin:  General: Skin is warm and dry.     Capillary Refill: Capillary refill takes less than 2 seconds.   Neurological:     General: No focal deficit present.     Mental Status: She is alert and oriented to person, place, and time.   Psychiatric:        Mood and Affect: Mood normal.     (all labs ordered are listed, but only abnormal results are displayed) Labs Reviewed  BASIC METABOLIC PANEL WITH GFR - Abnormal; Notable for the following components:      Result Value   CO2 21 (*)    Glucose, Bld 108 (*)    Anion gap 16 (*)    All other components within normal limits  CBC - Abnormal; Notable for the following components:    Platelets 401 (*)    All other components within normal limits  HCG, QUANTITATIVE, PREGNANCY - Abnormal; Notable for the following components:   hCG, Beta Chain, Quant, S 1,206 (*)    All other components within normal limits  PROTIME-INR  TROPONIN T, HIGH SENSITIVITY  TROPONIN T, HIGH SENSITIVITY    EKG: EKG Interpretation Date/Time:  Sunday February 19 2024 08:14:44 EDT Ventricular Rate:  100 PR Interval:  143 QRS Duration:  84 QT Interval:  318 QTC Calculation: 411 R Axis:   -38  Text Interpretation: Sinus tachycardia Biatrial enlargement Left axis deviation Low voltage, precordial leads Consider anterior infarct Minimal ST depression, diffuse leads No significant change since last tracing Confirmed by Lunden Stieber [phone]) on 02/19/2024 8:24:22 AM  Radiology: US  Venous Img Upper Uni Left Result Date: 02/19/2024 CLINICAL DATA:  Left elbow pain EXAM: LEFT UPPER EXTREMITY VENOUS DOPPLER ULTRASOUND TECHNIQUE: Gray-scale sonography with graded compression, as well as color Doppler and duplex ultrasound were performed to evaluate the upper extremity deep venous system from the level of the subclavian vein and including the jugular, axillary, basilic, radial, ulnar and upper cephalic vein. Spectral Doppler was utilized to evaluate flow at rest and with distal augmentation maneuvers. COMPARISON:  None Available. FINDINGS: Contralateral Subclavian Vein: Respiratory phasicity is normal and symmetric with the symptomatic side. No evidence of thrombus. Normal compressibility. Internal Jugular Vein: No evidence of thrombus. Normal compressibility, respiratory phasicity and response to augmentation. Subclavian Vein: No evidence of thrombus. Normal compressibility, respiratory phasicity and response to augmentation. Axillary Vein: No evidence of thrombus. Normal compressibility, respiratory phasicity and response to augmentation. Cephalic Vein: No evidence of thrombus. Normal compressibility, respiratory  phasicity and response to augmentation. Basilic Vein: No evidence of thrombus. Normal compressibility, respiratory phasicity and response to augmentation. Brachial Veins: No evidence of thrombus. Normal compressibility, respiratory phasicity and response to augmentation. Radial Veins: No evidence of thrombus. Normal compressibility, respiratory phasicity and response to augmentation. Ulnar Veins: No evidence of thrombus. Normal compressibility, respiratory phasicity and response to augmentation. Venous Reflux:  None visualized. Other Findings:  None visualized. IMPRESSION: No evidence of DVT within the left upper extremity. Electronically Signed   By: Wilkie Lent M.D.   On: 02/19/2024 09:52   CT Angio Chest PE W/Cm &/Or Wo Cm Result Date: 02/19/2024 CLINICAL DATA:  History of pulmonary embolism with chest and left arm pain EXAM: CT ANGIOGRAPHY CHEST WITH CONTRAST TECHNIQUE: Multidetector CT imaging of the chest was performed using the standard protocol during bolus administration of intravenous contrast. Multiplanar CT image reconstructions and MIPs were obtained to evaluate the vascular anatomy. RADIATION DOSE REDUCTION: This exam was performed according to the departmental dose-optimization program which includes automated  exposure control, adjustment of the mA and/or kV according to patient size and/or use of iterative reconstruction technique. CONTRAST:  75mL OMNIPAQUE IOHEXOL 350 MG/ML SOLN COMPARISON:  CTA chest dated 07/15/2017 FINDINGS: Cardiovascular: The study is high quality for the evaluation of pulmonary embolism. There are no filling defects in the central, lobar, segmental or subsegmental pulmonary artery branches to suggest acute pulmonary embolism. Great vessels are normal in course and caliber. Normal heart size. No significant pericardial fluid/thickening. Mediastinum/Nodes: Imaged thyroid  gland without nodules meeting criteria for imaging follow-up by size. Normal esophagus. No  pathologically enlarged axillary, supraclavicular, mediastinal, or hilar lymph nodes. Lungs/Pleura: The central airways are patent. 3 mm subpleural left lower lobe nodule (6:122), unchanged, likely benign. No specific follow-up imaging recommended. No focal consolidation. No pneumothorax. No pleural effusion. Upper abdomen: Normal. Musculoskeletal: No acute or abnormal lytic or blastic osseous lesions. Pectus excavatum. Review of the MIP images confirms the above findings. IMPRESSION: No evidence of pulmonary embolism or other acute intrathoracic process. Electronically Signed   By: Limin  Xu M.D.   On: 02/19/2024 09:30     Procedures   Medications Ordered in the ED  iohexol (OMNIPAQUE) 350 MG/ML injection 75 mL (75 mLs Intravenous Contrast Given 02/19/24 0911)                                    Medical Decision Making Amount and/or Complexity of Data Reviewed Labs: ordered. Radiology: ordered.  Risk Prescription drug management.   Patient currently pregnant says undergoing miscarriage.  Will get quantitative hCG.  But we will go ahead and do CT angio to rule out PE and get ultrasound left upper extremity to rule out DVT there.  No reason to check D-dimer.  With her being pregnant and having a miscarriage most likely will be elevated.  Will also check troponins and labs to rule out cardiac event.  EKG without any significant changes.  CT angio without any acute findings.  Patient's quantitative hCG is 1200 patient reports that it was 7000 before so it is coming down as expected for the miscarriage.  Patient is followed by OB/GYN.  Patient was just started on the Lovenox when she had a positive pregnancy test because in 2022 she did have the pulmonary embolus during pregnancy.  Patient's troponins x 2 both less than 15 which is reassuring.  Also ultrasound of the left upper extremity showed no evidence of DVT.  Patient stable for discharge home and follow-up with OB/GYN.  Patient without  any significant vaginal bleeding here today.  Patient not concerned about that.  She was just concerned about another pulmonary embolus.  Or blood clot in her left upper extremity.   Final diagnoses:  Atypical chest pain  Miscarriage    ED Discharge Orders     None          Geraldene Hamilton, MD 02/19/24 9155    Geraldene Hamilton, MD 02/19/24 [phone]

## 2024-02-19 NOTE — ED Triage Notes (Signed)
 Pt has hx PE, she is currently having a miscarriage and is on Lovenox. She states she had a PE even on Lovenox last time. Left arm ,upper is hurting and hand is tingling.this is also symptoms of her PE last time.     Pt states she always has to take Lovenox when she is pregnant,that's when she gets the PE's.

## 2024-02-21 ENCOUNTER — Inpatient Hospital Stay (HOSPITAL_COMMUNITY)
Admission: AD | Admit: 2024-02-21 | Discharge: 2024-02-21 | Disposition: A | Discharge status: Home / Self Care | Attending: Obstetrics | Admitting: Obstetrics

## 2024-02-21 ENCOUNTER — Encounter (HOSPITAL_COMMUNITY): Payer: Self-pay | Admitting: Obstetrics

## 2024-02-21 ENCOUNTER — Inpatient Hospital Stay (HOSPITAL_COMMUNITY)

## 2024-02-21 DIAGNOSIS — O034 Incomplete spontaneous abortion without complication: Secondary | ICD-10-CM

## 2024-02-21 DIAGNOSIS — Z3A01 Less than 8 weeks gestation of pregnancy: Secondary | ICD-10-CM

## 2024-02-21 LAB — CBC
HCT: 40.2 % (ref 36.0–46.0)
Hemoglobin: 13.6 g/dL (ref 12.0–15.0)
MCH: 32.4 pg (ref 26.0–34.0)
MCHC: 33.8 g/dL (ref 30.0–36.0)
MCV: 95.7 fL (ref 80.0–100.0)
Platelets: 400 10*3/uL (ref 150–400)
RBC: 4.2 MIL/uL (ref 3.87–5.11)
RDW: 12.6 % (ref 11.5–15.5)
WBC: 10.1 10*3/uL (ref 4.0–10.5)
nRBC: 0 % (ref 0.0–0.2)

## 2024-02-21 LAB — TYPE AND SCREEN
ABO/RH(D): O POS
Antibody Screen: NEGATIVE

## 2024-02-21 LAB — HCG, QUANTITATIVE, PREGNANCY: hCG, Beta Chain, Quant, S: 553 m[IU]/mL — ABNORMAL HIGH (ref <5)

## 2024-02-21 NOTE — MAU Note (Addendum)
 Vicki Ortega is a 38 y.o. at Unknown here in MAU reporting passing a clot that is 3ins in size and 3 inches wide. Called office and was told to come in. States she found out last Tues she was having a miscarriage. Was scheduled for D&C this past Thurs but was cancelled as pt started bleeding and wanted to wait and see if she would pass everything on her own. Started bleeding last weds. Using 4-5pads daily. Today about 1600 she passed the large clot. Very mild cramping. Pt is on Lovenox for hx PE. Pt's pad in Triage had moderate amt of bleeding and states she put on current pad 20 mins ago.   LMP: 12/18/23 Onset of complaint: last Tues Pain score: 2 Vitals:   02/21/24 2016 02/21/24 2019  BP:  (!) 140/84  Pulse: 84   Resp: 17   Temp: 99.7 F (37.6 C)   SpO2: 100%      FHT: n/a  Lab orders placed from triage: none

## 2024-02-21 NOTE — MAU Provider Note (Signed)
 Chief Complaint: Vaginal Bleeding   Event Date/Time   First Provider Initiated Contact with Patient 02/21/24 2131        SUBJECTIVE HPI: Vicki Ortega is a 38 y.o. G4P1011 at Unknown by LMP who presents to maternity admissions reporting bleeding.  Passed a 3x3 blood clot. Was diagnosed last week with a missed abortion and scheduled for D&C.  Decided to wait to see if she would pass everything naturally.  Hx is remarkable for DVT and has been on Lovenox for this.  Has an allergy to Cytotec.. She denies urinary symptoms, or fever/chills.    Vaginal Bleeding The patient's primary symptoms include pelvic pain and vaginal bleeding. The current episode started in the past 7 days. The pain is mild. The vaginal discharge was bloody. She has been passing clots. Nothing aggravates the symptoms. She has tried nothing for the symptoms.   RN Note:    Vicki Ortega is a 38 y.o. at Unknown here in MAU reporting passing a clot that is 3ins in size and 3 inches wide. Called office and was told to come in. States she found out last Tues she was having a miscarriage. Was scheduled for D&C this past Thurs but was cancelled as pt started bleeding and wanted to wait and see if she would pass everything on her own. Started bleeding last weds. Using 4-5pads daily. Today about 1600 she passed the large clot. Very mild cramping. Pt is on Lovenox for hx PE. Pt's pad in Triage had moderate amt of bleeding and states she put on current pad 20 mins ago.    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   Leukocytosis 07/18/2017   Miscarriage 07/18/2017   Morning sickness 07/18/2017   Normal intrauterine pregnancy in third trimester 09/30/2014   Other normal pregnancy, not first 10/01/2014   PCOS (polycystic ovarian syndrome)    Postpartum state 07/18/2017   POTS (postural  orthostatic tachycardia syndrome)    Pregnancy with history of infertility in first trimester 02/17/2013   Prolonged menstrual cycle 07/19/2012   Pulmonary embolism (HCC)    Retained products of conception without hemorrhage 04/26/2013   SVD (spontaneous vaginal delivery) 10/01/2014   Tachycardia    postural tachycardia syndrome-had cardiac ablation in 2004   Past Surgical History:  Procedure Laterality Date   CARDIAC ELECTROPHYSIOLOGY MAPPING AND ABLATION  2004   DILATION AND EVACUATION N/A 04/27/2013   Procedure: DILATATION AND EVACUATION;  Surgeon: Ezzie Marshall, MD;  Location: [facility];  Service: Gynecology;  Laterality: N/A;   egg retrieval  01/20/13   Social History   Socioeconomic History   Marital status: Married    Spouse name: Not on file   Number of children: Not on file   Years of education: Not on file   Highest education level: Not on file  Occupational History   Not on file  Tobacco Use   Smoking status: Never   Smokeless tobacco: Never  Substance and Sexual Activity   Alcohol use: No   Drug use: No   Sexual activity: Yes  Other Topics Concern   Not on file  Social History Narrative   Not on file   Social Drivers of Health   Financial Resource Strain: Not on file  Food Insecurity: Not on file  Transportation Needs: No Transportation Needs (01/19/2021)   [facility]   PRAPARE - Transportation    In the past 12 months, has lack of transportation kept you from medical appointments or from getting medications?: No    Lack of Transportation (Non-Medical): No  Physical Activity: Not on file  Stress: Not on file  Social Connections: Not on file  Intimate Partner Violence: Not on file   No current facility-administered medications on file prior to encounter.   Current Outpatient Medications on File Prior to Encounter  Medication Sig Dispense Refill   enoxaparin (LOVENOX) 40 MG/0.4ML injection Inject 40 mg into the skin daily.  PATIENT STATED LAST DOSE WAS 02/14/24 PER SURGEON INSTRUCTIONS.     metoprolol  succinate (TOPROL -XL) 50 MG 24 hr tablet Take 1 tablet (50 mg total) by mouth daily. Take with or immediately following a meal. (Patient not taking: Reported on 02/15/2024) 90 tablet 3   metoprolol  tartrate (LOPRESSOR ) 25 MG tablet Take 0.5 tablets (12.5 mg total) 2 (two) times daily as needed by mouth (heart rate over 100). (Patient not taking: Reported on 02/15/2024) 50 tablet 0   Allergies  Allergen Reactions   Cytotec [Misoprostol] Swelling    Lips swelled.    I have reviewed patient's Past Medical Hx, Surgical Hx, Family Hx, Social Hx, medications and allergies.   ROS:  Review of Systems  Genitourinary:  Positive for pelvic pain and vaginal bleeding.   Review of Systems  Other systems negative   Physical Exam  Physical Exam Patient Vitals for the past 24 hrs:  BP Temp Pulse Resp SpO2 Height Weight  02/21/24 2019 (!) 140/84 -- -- -- -- -- --  02/21/24 2016 -- 99.7 F (37.6 C) 84 17 100 % 5' 4 (1.626 m) 65.7 kg   Constitutional: Well-developed, well-nourished female in no acute distress.  Cardiovascular: normal rate Respiratory: normal effort GI: Abd soft, non-tender.  MS: Extremities nontender, no edema, normal ROM Neurologic: Alert and oriented x 4.    LAB RESULTS Results for orders placed or performed during the hospital encounter of 02/21/24 (from the past 24 hours)  Type and screen [facility]     Status: None   Collection Time: 02/21/24  8:44 PM  Result Value Ref Range   ABO/RH(D) O POS    Antibody Screen NEG    Sample Expiration      02/24/2024,2359 Performed at [facility], [address], [place], [place] [address]   hCG, quantitative, pregnancy     Status: Abnormal   Collection Time: 02/21/24  8:47 PM  Result Value Ref Range   hCG, Beta Chain, Quant, S 553 (H) <5 mIU/mL  CBC     Status: None   Collection Time: 02/21/24  8:47 PM  Result Value Ref Range    WBC 10.1 4.0 - 10.5 K/uL   RBC 4.20 3.87 - 5.11 MIL/uL   Hemoglobin 13.6 12.0 - 15.0 g/dL   HCT 59.7 63.9 - 53.9 %   MCV 95.7 80.0 - 100.0 fL   MCH 32.4 26.0 - 34.0 pg   MCHC 33.8 30.0 - 36.0 g/dL   RDW 87.3 88.4 - 84.4 %   Platelets 400 150 - 400 K/uL   nRBC 0.0 0.0 - 0.2 %    --/--/PENDING (06/24 2044)  IMAGING US  OB Transvaginal Result Date: 02/21/2024 CLINICAL DATA:  Vaginal bleeding.  Likely miscarriage in progress. EXAM: TRANSVAGINAL OB ULTRASOUND TECHNIQUE: Transvaginal ultrasound was performed for complete evaluation of the gestation as well as the maternal uterus, adnexal regions, and pelvic  cul-de-sac. COMPARISON:  None Available. FINDINGS: Intrauterine gestational sac: None Yolk sac:  Not Visualized. Embryo:  Not Visualized. Cardiac Activity: Not Visualized. Heart Rate:  bpm MSD:   mm    w     d CRL:     mm    w  d                  US  EDC: Subchorionic hemorrhage:  None visualized. Maternal uterus/adnexae: No adnexal mass or free fluid. Endometrium is thickened measuring 24 mm and heterogeneous. Some internal blood flow. Findings concerning for retained products of conception. IMPRESSION: No intrauterine gestation visualized. Thickened, heterogeneous endometrium concerning for retained products of conception. Electronically Signed   By: Franky Crease M.D.   On: 02/21/2024 21:36     MAU Management/MDM: I have reviewed the triage vital signs and the nursing notes.   Pertinent labs & imaging results that were available during my care of the patient were reviewed by me and considered in my medical decision making (see chart for details).      I have reviewed her medical records including past results, notes and treatments. Medical, Surgical, and family history were reviewed.  Medications and recent lab tests were reviewed  Ordered usual first trimester r/o ectopic labs.   Will check baseline Ultrasound to rule out ectopic.  Discussed drop of HCG from 1200s to 500s. Likely  reflects miscarriage.  Discussed there may be some tissue left to pass.   Recommend followup in office as planned.   ASSESSMENT Pregnancy at [redacted]w[redacted]d by LMP Incomplete abortion  PLAN Discharge home Bleeding precautions Recommend followup in office as planned.   Pt stable at time of discharge. Encouraged to return here if she develops worsening of symptoms, increase in pain, fever, or other concerning symptoms.    Earnie Pouch CNM, MSN Certified Nurse-Midwife 02/21/2024  9:31 PM
